# Patient Record
Sex: Male | Born: 1960 | Race: White | Hispanic: No | Marital: Married | Smoking: Never smoker
Health system: Southern US, Community
[De-identification: ages and names within clinical notes are randomized; demographics above are authoritative.]

## PROBLEM LIST (undated history)

## (undated) DIAGNOSIS — E079 Disorder of thyroid, unspecified: Secondary | ICD-10-CM

## (undated) DIAGNOSIS — E785 Hyperlipidemia, unspecified: Secondary | ICD-10-CM

## (undated) DIAGNOSIS — M1990 Unspecified osteoarthritis, unspecified site: Secondary | ICD-10-CM

## (undated) HISTORY — DX: Unspecified osteoarthritis, unspecified site: M19.90

## (undated) HISTORY — DX: Hyperlipidemia, unspecified: E78.5

## (undated) HISTORY — DX: Disorder of thyroid, unspecified: E07.9

## (undated) HISTORY — PX: HERNIA REPAIR: SHX51

---

## 2001-02-22 ENCOUNTER — Encounter: Payer: Self-pay | Admitting: Family Medicine

## 2001-02-22 ENCOUNTER — Encounter: Admission: RE | Admit: 2001-02-22 | Discharge: 2001-02-22 | Payer: Self-pay | Admitting: Family Medicine

## 2005-03-29 ENCOUNTER — Encounter: Admission: RE | Admit: 2005-03-29 | Discharge: 2005-03-29 | Payer: Self-pay | Admitting: Family Medicine

## 2010-12-22 ENCOUNTER — Other Ambulatory Visit: Payer: Self-pay | Admitting: Family Medicine

## 2010-12-28 ENCOUNTER — Ambulatory Visit
Admission: RE | Admit: 2010-12-28 | Discharge: 2010-12-28 | Disposition: A | Discharge status: Home / Self Care | Payer: BC Managed Care – PPO | Source: Ambulatory Visit | Attending: Family Medicine | Admitting: Family Medicine

## 2013-05-09 IMAGING — US US ABDOMEN COMPLETE
1 series · 14 of 25 positions shown · non-contrast
Comparison: None.

CLINICAL DATA: Elevated LFTs.

COMPLETE ABDOMINAL ULTRASOUND

[Series 1: us abdomen complete · 0.27mm/px · 14 of 79 slices shown]
[im 1/79]
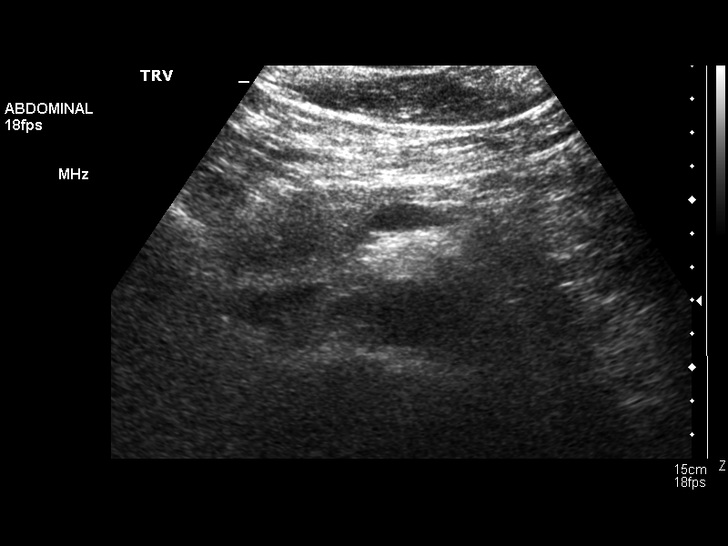
[im 7/79]
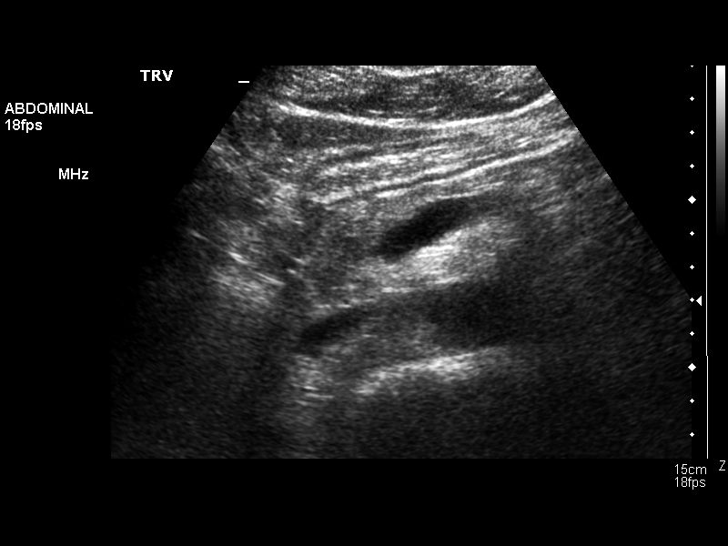
[im 14/79]
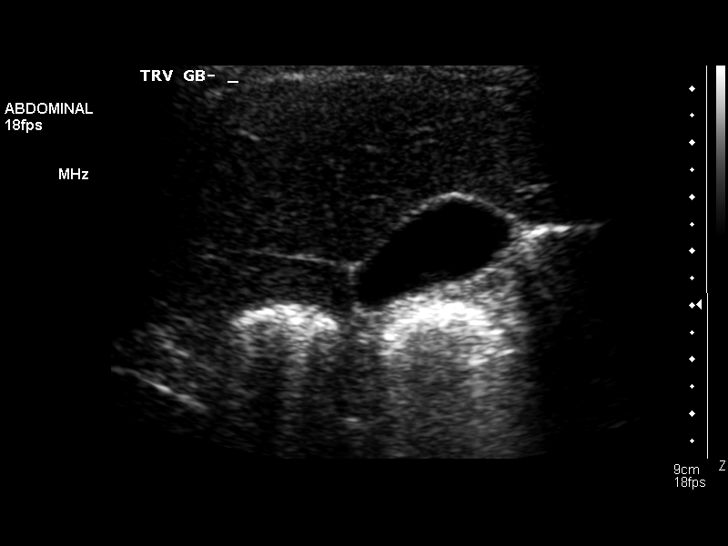
[im 20/79]
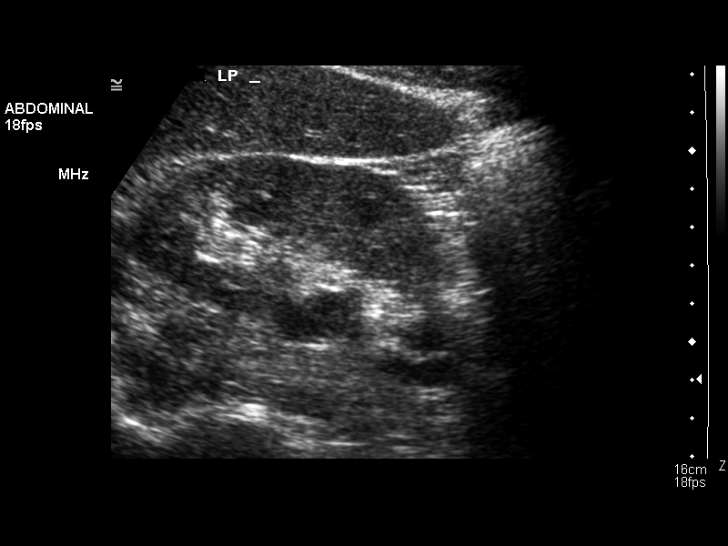
[im 27/79]
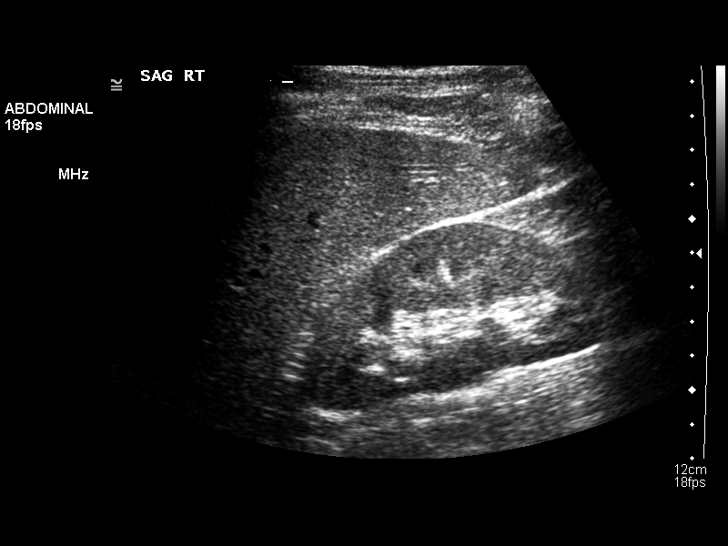
[im 30/79]
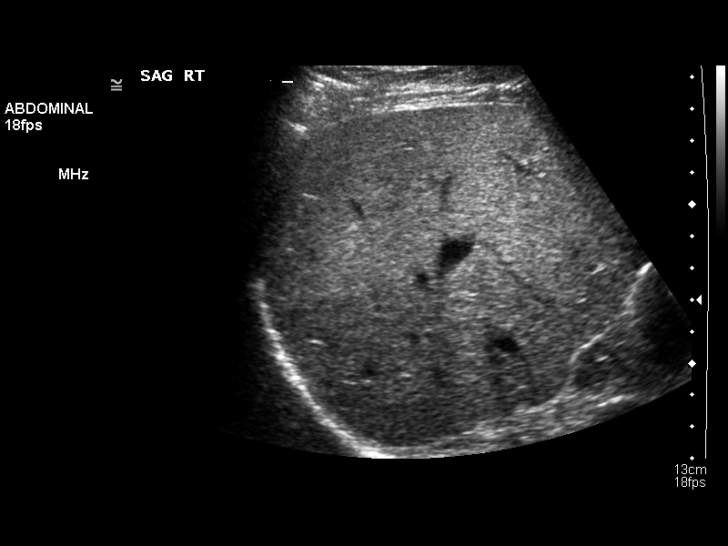
[im 36/79]
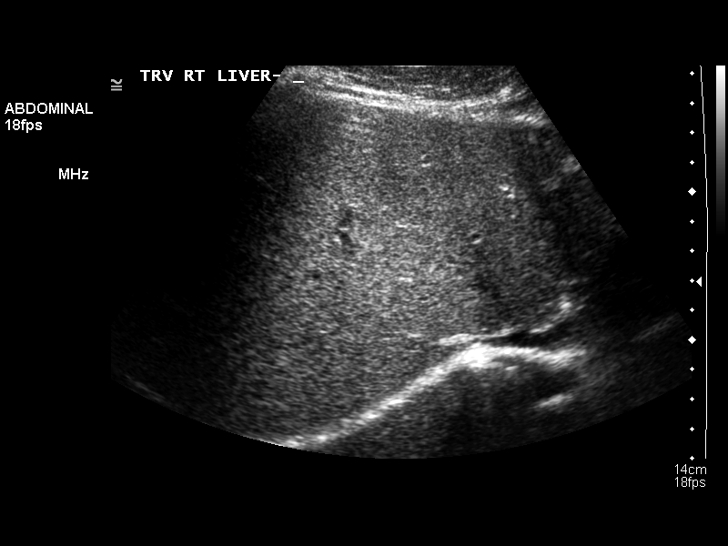
[im 43/79]
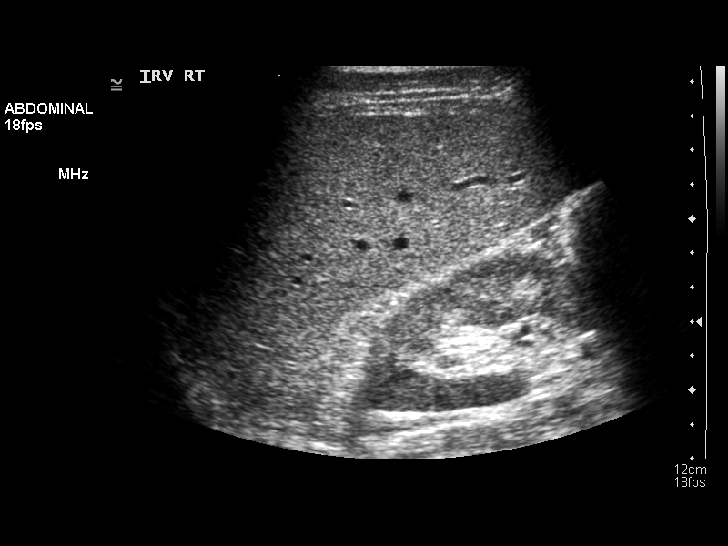
[im 49/79]
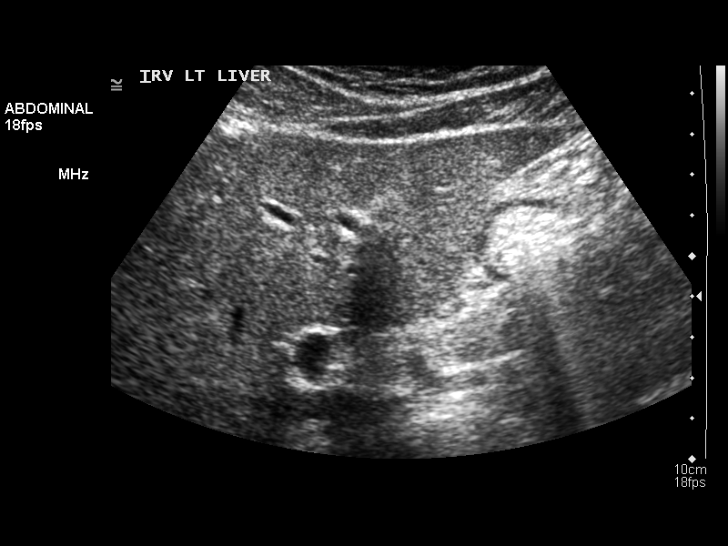
[im 53/79]
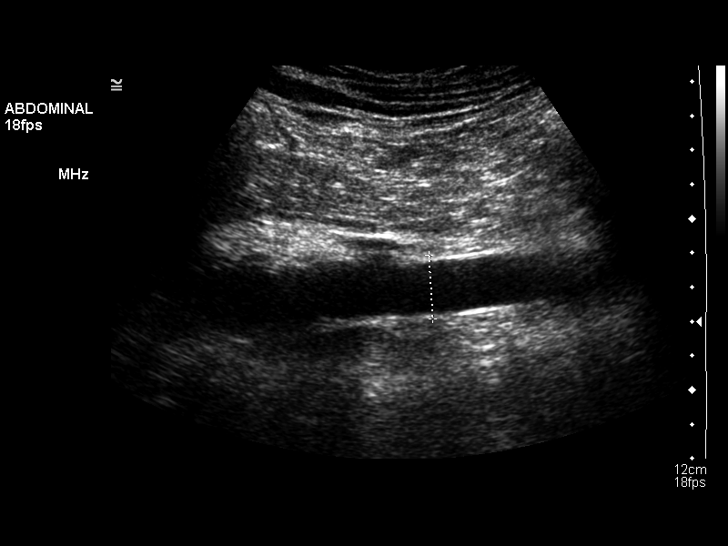
[im 59/79]
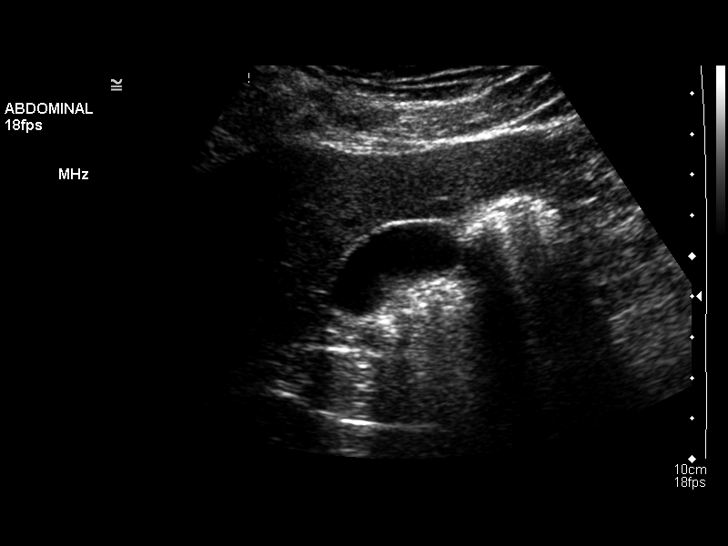
[im 66/79]
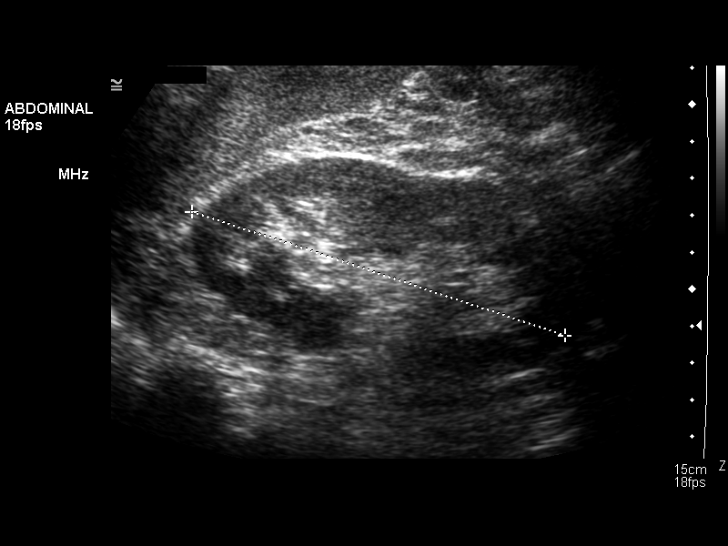
[im 72/79]
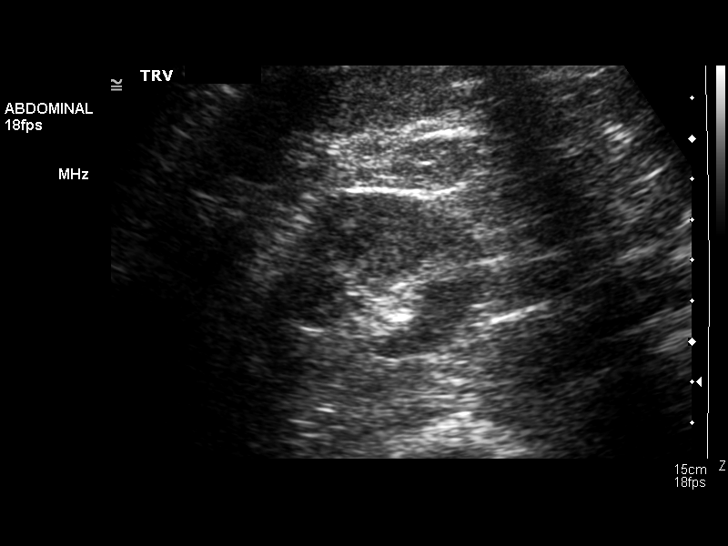
[im 79/79]
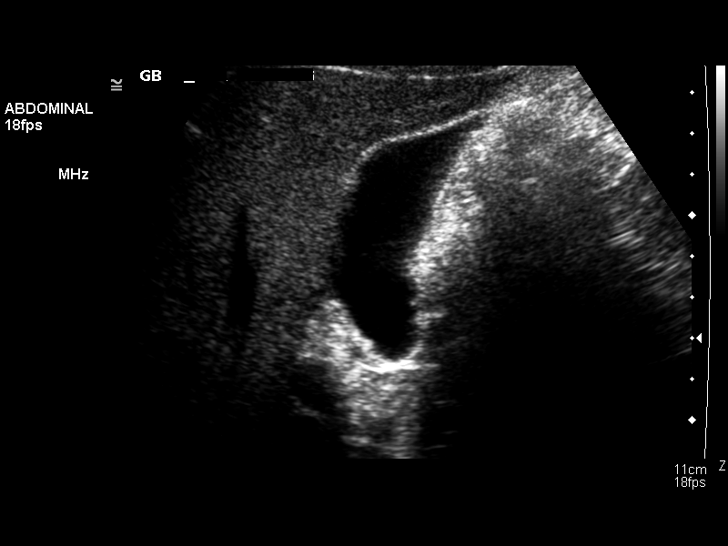

[14 of 25 positions shown; findings below may reference images not displayed]

FINDINGS: Gallbladder:  No stones or wall thickening.  Negative sonographic
Jumper.

Common bile duct:   Normal caliber, 4 mm.

Liver:  No focal lesion identified.  Within normal limits in
parenchymal echogenicity.

IVC:  Appears normal.

Pancreas:  No focal abnormality seen.

Spleen:  Within normal limits in size and echotexture.

Right Kidney:   Normal in size and parenchymal echogenicity.  No
evidence of mass or hydronephrosis.

Left Kidney:  Normal in size and parenchymal echogenicity.  No
evidence of mass or hydronephrosis.

Abdominal aorta:  No aneurysm identified.
IMPRESSION: No acute findings or significant abnormality.

## 2013-09-27 LAB — HM COLONOSCOPY

## 2013-10-03 ENCOUNTER — Other Ambulatory Visit (HOSPITAL_BASED_OUTPATIENT_CLINIC_OR_DEPARTMENT_OTHER): Payer: Self-pay | Admitting: Physician Assistant

## 2013-10-03 DIAGNOSIS — R109 Unspecified abdominal pain: Secondary | ICD-10-CM

## 2013-10-08 ENCOUNTER — Ambulatory Visit (HOSPITAL_BASED_OUTPATIENT_CLINIC_OR_DEPARTMENT_OTHER): Admission: RE | Admit: 2013-10-08 | Payer: BC Managed Care – PPO | Source: Ambulatory Visit

## 2014-10-15 LAB — LIPID PANEL
Cholesterol: 162 mg/dL (ref 0–200)
HDL: 31 mg/dL — AB (ref 35–70)
LDL Cholesterol: 79 mg/dL
LDl/HDL Ratio: 5.3
Triglycerides: 265 mg/dL — AB (ref 40–160)

## 2014-10-15 LAB — HEPATIC FUNCTION PANEL
ALT: 29 U/L (ref 10–40)
AST: 23 U/L (ref 14–40)
Alkaline Phosphatase: 88 U/L (ref 25–125)
Bilirubin, Total: 1.6 mg/dL

## 2014-10-15 LAB — BASIC METABOLIC PANEL
BUN: 16 mg/dL (ref 4–21)
Creatinine: 0.9 mg/dL (ref 0.6–1.3)
Glucose: 92 mg/dL
Potassium: 4.3 mmol/L (ref 3.4–5.3)
Sodium: 138 mmol/L (ref 137–147)

## 2015-12-22 ENCOUNTER — Encounter: Payer: Self-pay | Admitting: General Practice

## 2015-12-22 ENCOUNTER — Ambulatory Visit (INDEPENDENT_AMBULATORY_CARE_PROVIDER_SITE_OTHER): Payer: BC Managed Care – PPO | Admitting: Family Medicine

## 2015-12-22 ENCOUNTER — Encounter: Payer: Self-pay | Admitting: Family Medicine

## 2015-12-22 VITALS — BP 110/72 | HR 72 | Temp 98.0°F | Resp 16 | Ht 72.0 in | Wt 194.5 lb

## 2015-12-22 DIAGNOSIS — Z0000 Encounter for general adult medical examination without abnormal findings: Secondary | ICD-10-CM | POA: Diagnosis not present

## 2015-12-22 DIAGNOSIS — Z23 Encounter for immunization: Secondary | ICD-10-CM

## 2015-12-22 LAB — BASIC METABOLIC PANEL
BUN: 14 mg/dL (ref 6–23)
CALCIUM: 9.6 mg/dL (ref 8.4–10.5)
CO2: 30 mEq/L (ref 19–32)
CREATININE: 0.98 mg/dL (ref 0.40–1.50)
Chloride: 104 mEq/L (ref 96–112)
GFR: 84.06 mL/min (ref >60.00)
Glucose, Bld: 83 mg/dL (ref 70–99)
Potassium: 3.9 mEq/L (ref 3.5–5.1)
SODIUM: 141 meq/L (ref 135–145)

## 2015-12-22 LAB — HEPATIC FUNCTION PANEL
ALT: 27 U/L (ref 0–53)
AST: 18 U/L (ref 0–37)
Albumin: 4.6 g/dL (ref 3.5–5.2)
Alkaline Phosphatase: 106 U/L (ref 39–117)
BILIRUBIN DIRECT: 0.3 mg/dL (ref 0.0–0.3)
BILIRUBIN TOTAL: 2.5 mg/dL — AB (ref 0.2–1.2)
Total Protein: 7.2 g/dL (ref 6.0–8.3)

## 2015-12-22 LAB — CBC WITH DIFFERENTIAL/PLATELET
BASOS PCT: 0.3 % (ref 0.0–3.0)
Basophils Absolute: 0 10*3/uL (ref 0.0–0.1)
EOS ABS: 0 10*3/uL (ref 0.0–0.7)
EOS PCT: 0.6 % (ref 0.0–5.0)
HEMATOCRIT: 44.9 % (ref 39.0–52.0)
HEMOGLOBIN: 15.4 g/dL (ref 13.0–17.0)
LYMPHS PCT: 34.1 % (ref 12.0–46.0)
Lymphs Abs: 2.5 10*3/uL (ref 0.7–4.0)
MCHC: 34.4 g/dL (ref 30.0–36.0)
MCV: 94.1 fl (ref 78.0–100.0)
MONOS PCT: 8.4 % (ref 3.0–12.0)
Monocytes Absolute: 0.6 10*3/uL (ref 0.1–1.0)
NEUTROS ABS: 4.2 10*3/uL (ref 1.4–7.7)
Neutrophils Relative %: 56.6 % (ref 43.0–77.0)
Platelets: 265 10*3/uL (ref 150.0–400.0)
RBC: 4.77 Mil/uL (ref 4.22–5.81)
RDW: 12.7 % (ref 11.5–15.5)
WBC: 7.4 10*3/uL (ref 4.0–10.5)

## 2015-12-22 LAB — LIPID PANEL
CHOL/HDL RATIO: 5
Cholesterol: 194 mg/dL (ref 0–200)
HDL: 43 mg/dL (ref >39.00)
LDL Cholesterol: 117 mg/dL — ABNORMAL HIGH (ref 0–99)
NONHDL: 150.87
Triglycerides: 171 mg/dL — ABNORMAL HIGH (ref 0.0–149.0)
VLDL: 34.2 mg/dL (ref 0.0–40.0)

## 2015-12-22 LAB — PSA: PSA: 0.88 ng/mL (ref 0.10–4.00)

## 2015-12-22 LAB — TSH: TSH: 1.95 u[IU]/mL (ref 0.35–4.50)

## 2015-12-22 NOTE — Progress Notes (Signed)
Pre visit review using our clinic review tool, if applicable. No additional management support is needed unless otherwise documented below in the visit note. 

## 2015-12-22 NOTE — Progress Notes (Signed)
   Subjective:    Patient ID: Juan Oneill, male    DOB: [birth year], 55 y.o.   MRN: [id number]  HPI New to establish.  Previous MD- [facility]- done 2015 in [place] at [facility], due for repeat 2018  CPE- no concerns today.  No record of Tdap.  UTD on flu shot.    Review of Systems Patient reports no vision/hearing changes, anorexia, fever ,adenopathy, persistant/recurrent hoarseness, swallowing issues, chest pain, palpitations, edema, persistant/recurrent cough, hemoptysis, dyspnea (rest,exertional, paroxysmal nocturnal), gastrointestinal  bleeding (melena, rectal bleeding), abdominal pain, excessive heart burn, GU symptoms (dysuria, hematuria, voiding/incontinence issues) syncope, focal weakness, memory loss, numbness & tingling, skin/hair/nail changes, depression, anxiety, abnormal bruising/bleeding, musculoskeletal symptoms/signs.     Objective:   Physical Exam BP 110/72   Pulse 72   Temp 98 F (36.7 C) (Oral)   Resp 16   Ht 6' (1.829 m)   Wt 194 lb 8 oz (88.2 kg)   SpO2 98%   BMI 26.38 kg/m   General Appearance:    Alert, cooperative, no distress, appears stated age  Head:    Normocephalic, without obvious abnormality, atraumatic  Eyes:    PERRL, conjunctiva/corneas clear, EOM's intact, fundi    benign, both eyes       Ears:    Normal TM's and external ear canals, both ears  Nose:   Nares normal, septum midline, mucosa normal, no drainage   or sinus tenderness  Throat:   Lips, mucosa, and tongue normal; teeth and gums normal  Neck:   Supple, symmetrical, trachea midline, no adenopathy;       thyroid:  No enlargement/tenderness/nodules  Back:     Symmetric, no curvature, ROM normal, no CVA tenderness  Lungs:     Clear to auscultation bilaterally, respirations unlabored  Chest wall:    No tenderness or deformity  Heart:    Regular rate and rhythm, S1 and S2 normal, no murmur, rub   or gallop  Abdomen:     Soft, non-tender, bowel sounds active  all four quadrants,    no masses, no organomegaly  Genitalia:    Normal male without lesion, discharge or tenderness  Rectal:    Normal tone, normal prostate, no masses or tenderness  Extremities:   Extremities normal, atraumatic, no cyanosis or edema  Pulses:   2+ and symmetric all extremities  Skin:   Skin color, texture, turgor normal, no rashes or lesions  Lymph nodes:   Cervical, supraclavicular, and axillary nodes normal  Neurologic:   CNII-XII intact. Normal strength, sensation and reflexes      throughout          Assessment & Plan:  PE- pt's PE WNL.  UTD on colonoscopy until next year.  Has already had flu shot.  Tdap updated today.  Check labs.  Anticipatory guidance provided.

## 2015-12-22 NOTE — Addendum Note (Signed)
Addended by: Desmond Dike L on: 12/22/2015 03:00 PM   Modules accepted: Orders

## 2015-12-22 NOTE — Patient Instructions (Signed)
Follow up in 1 year or as needed We'll notify you of your lab results and make any changes if needed Continue to work on healthy diet and regular exercise- you look great! We updated your tetanus shot today Call with any questions or concerns Welcome!  We're glad to have you!!!

## 2015-12-23 ENCOUNTER — Encounter: Payer: Self-pay | Admitting: General Practice

## 2016-05-20 ENCOUNTER — Emergency Department (HOSPITAL_BASED_OUTPATIENT_CLINIC_OR_DEPARTMENT_OTHER): Payer: BC Managed Care – PPO

## 2016-05-20 ENCOUNTER — Emergency Department (HOSPITAL_BASED_OUTPATIENT_CLINIC_OR_DEPARTMENT_OTHER)
Admission: EM | Admit: 2016-05-20 | Discharge: 2016-05-20 | Disposition: A | Discharge status: Home / Self Care | Payer: BC Managed Care – PPO | Attending: Emergency Medicine | Admitting: Emergency Medicine

## 2016-05-20 ENCOUNTER — Encounter (HOSPITAL_BASED_OUTPATIENT_CLINIC_OR_DEPARTMENT_OTHER): Payer: Self-pay | Admitting: *Deleted

## 2016-05-20 DIAGNOSIS — Y929 Unspecified place or not applicable: Secondary | ICD-10-CM | POA: Diagnosis not present

## 2016-05-20 DIAGNOSIS — W5519XA Other contact with horse, initial encounter: Secondary | ICD-10-CM | POA: Diagnosis not present

## 2016-05-20 DIAGNOSIS — Y939 Activity, unspecified: Secondary | ICD-10-CM | POA: Insufficient documentation

## 2016-05-20 DIAGNOSIS — Y999 Unspecified external cause status: Secondary | ICD-10-CM | POA: Insufficient documentation

## 2016-05-20 DIAGNOSIS — S6990XA Unspecified injury of unspecified wrist, hand and finger(s), initial encounter: Secondary | ICD-10-CM

## 2016-05-20 DIAGNOSIS — S61215A Laceration without foreign body of left ring finger without damage to nail, initial encounter: Secondary | ICD-10-CM | POA: Diagnosis not present

## 2016-05-20 MED ORDER — CEPHALEXIN 500 MG PO CAPS: 500.0000 mg | ORAL_CAPSULE | Freq: Three times a day (TID) | ORAL | 0 refills | Status: DC

## 2016-05-20 MED ORDER — BACITRACIN ZINC 500 UNIT/GM EX OINT
TOPICAL_OINTMENT | Freq: Two times a day (BID) | CUTANEOUS | Status: DC
  Administered 2016-05-20: 13:00:00 via TOPICAL

## 2016-05-20 MED ORDER — LIDOCAINE HCL 2 % IJ SOLN
10.0000 mL | Freq: Once | INTRAMUSCULAR | Status: AC
  Administered 2016-05-20: 200 mg via INTRADERMAL
  Filled 2016-05-20: qty 20

## 2016-05-20 NOTE — ED Notes (Signed)
Laceration noted to knuckle area 4th finger left hand, about 4-5 cm, bleeding controlled. CMS intact to left hand/nailbeds.

## 2016-05-20 NOTE — ED Triage Notes (Signed)
Patient states he was moving his horse and was kicked.  Laceration to the fourth left finger, bleeding controlled.

## 2016-05-20 NOTE — Discharge Instructions (Signed)
Keep your finger clean and dry. Apply bacitracin or neosporin 2 times a day. Keep finger in a splint until all healed. Suture removal in 10 days. Keflex prophylactically to prevent infection. Follow up as needed.

## 2016-05-20 NOTE — ED Provider Notes (Signed)
[facility] DEPT [facility] Provider Note   CSN: [id number] Arrival date & time: 05/20/16  1036     History   Chief Complaint Chief Complaint  Patient presents with  . Laceration    left fourth finger    HPI Juan Oneill is a 56 y.o. male.  HPI Juan Oneill is a 56 y.o. male presents to emergency department complaining of left ring finger injury. Patient states he was kicked by horse. States the home. Of course hit him right on the left ring finger sustaining a laceration to the finger. He states he went to urgent care was sent here for possible tendon injury. Dressing was applied to the injured side. States bleeding is controlled with pressure. Reports his tetanus up to date. Denies any numbness or weakness to the finger. No prior injuries to this finger.  Past Medical History:  Diagnosis Date  . Arthritis    GSo orto Dx with arthritis    There are no active problems to display for this patient.   Past Surgical History:  Procedure Laterality Date  . HERNIA REPAIR         Home Medications    Prior to Admission medications   Not on File    Family History Family History  Problem Relation Age of Onset  . Healthy Mother   . Cancer Paternal Grandmother     colon  . Diabetes Paternal Grandmother     Social History Social History  Substance Use Topics  . Smoking status: Never Smoker  . Smokeless tobacco: Never Used  . Alcohol use 1.2 oz/week    2 Cans of beer per week     Allergies   Patient has no known allergies.   Review of Systems Review of Systems  Constitutional: Negative for chills and fever.  Musculoskeletal: Positive for arthralgias.  Skin: Positive for wound.  Neurological: Negative for weakness and numbness.     Physical Exam Updated Vital Signs BP 111/74 (BP Location: Left Arm)   Pulse 80   Temp 98.4 F (36.9 C) (Oral)   Resp 20   Ht 6' (1.829 m)   Wt 90.7 kg   SpO2 100%   BMI 27.12 kg/m   Physical Exam    Constitutional: He appears well-developed and well-nourished. No distress.  Eyes: Conjunctivae are normal.  Neck: Neck supple.  Cardiovascular: Normal rate.   Pulmonary/Chest: No respiratory distress.  Abdominal: He exhibits no distension.  Musculoskeletal:  3 cm laceration to the dorsal left ring finger over proximal phalanx. No joint involvement. Full range of motion of the finger at MCP, PIP, DIP joints. Normal strength with flexion and extension. Capillary refill less than 2 seconds distally. Sensation is intact distally to the finger.  Skin: Skin is warm and dry.  Nursing note and vitals reviewed.    ED Treatments / Results  Labs (all labs ordered are listed, but only abnormal results are displayed) Labs Reviewed - No data to display  EKG  EKG Interpretation None       Radiology No results found.  Procedures Procedures (including critical care time) LACERATION REPAIR Performed by: Renold Genta Authorized by: Jeannett Senior A Consent: Verbal consent obtained. Risks and benefits: risks, benefits and alternatives were discussed Consent given by: patient Patient identity confirmed: provided demographic data Prepped and Draped in normal sterile fashion Wound explored  Laceration Location: left ring finger  Laceration Length: 3cm  No Foreign Bodies seen or palpated  Anesthesia: local infiltration  Local anesthetic: lidocaine  2% wo epinephrine  Anesthetic total: 3 ml  Irrigation method: syringe Amount of cleaning: standard  Skin closure: prolene 4.0  Number of sutures: 5  Technique: simple interrupted  Patient tolerance: Patient tolerated the procedure well with no immediate complications.  Medications Ordered in ED Medications  lidocaine (XYLOCAINE) 2 % (with pres) injection 200 mg (not administered)     Initial Impression / Assessment and Plan / ED Course  I have reviewed the triage vital signs and the nursing notes.  Pertinent  labs & imaging results that were available during my care of the patient were reviewed by me and considered in my medical decision making (see chart for details).     Pt with deep laceration to the left ring finger, neurovascularly intact with normal sensation distally, cap refill <2 sec distally. Xray negative. Wound irrigated thoroughly, tendon is visualized and intact. Normal movement and strength of the finger. Repaired with sutures. Pt did complain of sensation of dirt in left eye, rn to irrigate with saline.   Plan to splint finger, wound care at home, suture removal in 10 days. Will give referral to hand surgery if any issues.   Vitals:   05/20/16 1042 05/20/16 1043 05/20/16 1246  BP: 111/74  126/83  Pulse: 80  82  Resp: 20  18  Temp: 98.4 F (36.9 C)    TempSrc: Oral    SpO2: 100%  100%  Weight:  90.7 kg   Height:  6' (1.829 m)      Final Clinical Impressions(s) / ED Diagnoses   Final diagnoses:  Finger injury  Laceration of left ring finger without foreign body without damage to nail, initial encounter    New Prescriptions Discharge Medication List as of 05/20/2016 12:36 PM    START taking these medications   Details  cephALEXin (KEFLEX) 500 MG capsule Take 1 capsule (500 mg total) by mouth 3 (three) times daily., Starting Sat 05/20/2016, Print         Jeannett Senior, PA-C 05/20/16 [place], DO 05/21/16 0745

## 2016-05-31 ENCOUNTER — Encounter: Payer: Self-pay | Admitting: Physician Assistant

## 2016-05-31 ENCOUNTER — Ambulatory Visit (INDEPENDENT_AMBULATORY_CARE_PROVIDER_SITE_OTHER): Payer: BC Managed Care – PPO | Admitting: Physician Assistant

## 2016-05-31 NOTE — Progress Notes (Signed)
Erroneous encounter-disregard

## 2016-05-31 NOTE — Progress Notes (Signed)
Pre visit review using our clinic review tool, if applicable. No additional management support is needed unless otherwise documented below in the visit note. 

## 2016-06-02 ENCOUNTER — Encounter: Payer: Self-pay | Admitting: Physician Assistant

## 2016-06-02 ENCOUNTER — Ambulatory Visit (INDEPENDENT_AMBULATORY_CARE_PROVIDER_SITE_OTHER): Payer: BC Managed Care – PPO | Admitting: Physician Assistant

## 2016-06-02 VITALS — BP 106/60 | HR 88 | Temp 98.7°F | Resp 14 | Ht 72.0 in | Wt 197.0 lb

## 2016-06-02 DIAGNOSIS — Z4802 Encounter for removal of sutures: Secondary | ICD-10-CM

## 2016-06-02 NOTE — Patient Instructions (Signed)
Please keep skin clean and dry. You can apply very slight amount of lotion on the area to help with scarring and itching. Gentle squeeze a stress ball to help with ROM of hand.  Keep elevated. Swelling will continue to resolve.  If you note any redness, drainage, pain in the area please call or come see [place].

## 2016-06-02 NOTE — Progress Notes (Signed)
    Patient presents to clinic today for suture removal of left 4th phalanx. Patient seen in ER on 05/20/16 for laceration. Imaging obtained at that time shows non intraarticular laceration. 5 simple, interrupted sutures placed. Patient was instructed when to return for suture removal. Denies any redness, drainage or pain at site. Denies fever, chills. Did hit finger on nightstand early this morning and noted mild soft tissue swelling that is already resolving.   Past Medical History:  Diagnosis Date  . Arthritis    GSo orto Dx with arthritis    No current outpatient prescriptions on file prior to visit.   No current facility-administered medications on file prior to visit.     No Known Allergies  Family History  Problem Relation Age of Onset  . Healthy Mother   . Cancer Paternal Grandmother     colon  . Diabetes Paternal Grandmother     Social History   Social History  . Marital status: Married    Spouse name: N/A  . Number of children: N/A  . Years of education: N/A   Social History Main Topics  . Smoking status: Never Smoker  . Smokeless tobacco: Never Used  . Alcohol use 1.2 oz/week    2 Cans of beer per week  . Drug use: No  . Sexual activity: Not Asked   Other Topics Concern  . None   Social History Narrative  . None   Review of Systems - See HPI.  All other ROS are negative.  BP 106/60   Pulse 88   Temp 98.7 F (37.1 C) (Oral)   Resp 14   Ht 6' (1.829 m)   Wt 197 lb (89.4 kg)   SpO2 98%   BMI 26.72 kg/m   Physical Exam  Constitutional: He is well-developed, well-nourished, and in no distress.  HENT:  Head: Normocephalic and atraumatic.  Cardiovascular: Normal rate and regular rhythm.   Pulmonary/Chest: Effort normal.  Skin: Skin is warm and dry.     Psychiatric: Affect normal.  Vitals reviewed.   No results found for this or any previous visit (from the past 2160 hour(s)).  Assessment/Plan: 1. Visit for suture removal 5 sutures  successfully removed without complication. Discussed wound care and healing time frame. Gentle stretches discussed to work with ROM now that sutures are removed. If not improving, will have hand specialist consult.   Leeanne Rio, PA-C

## 2016-06-02 NOTE — Progress Notes (Signed)
Pre visit review using our clinic review tool, if applicable. No additional management support is needed unless otherwise documented below in the visit note. 

## 2017-02-22 ENCOUNTER — Encounter: Payer: Self-pay | Admitting: Family Medicine

## 2017-02-22 ENCOUNTER — Ambulatory Visit (INDEPENDENT_AMBULATORY_CARE_PROVIDER_SITE_OTHER): Payer: BC Managed Care – PPO | Admitting: Family Medicine

## 2017-02-22 ENCOUNTER — Encounter: Payer: BC Managed Care – PPO | Admitting: Family Medicine

## 2017-02-22 VITALS — BP 126/80 | HR 59 | Temp 97.7°F | Ht 72.5 in | Wt 193.0 lb

## 2017-02-22 DIAGNOSIS — Z0000 Encounter for general adult medical examination without abnormal findings: Secondary | ICD-10-CM | POA: Diagnosis not present

## 2017-02-22 DIAGNOSIS — D126 Benign neoplasm of colon, unspecified: Secondary | ICD-10-CM | POA: Insufficient documentation

## 2017-02-22 DIAGNOSIS — Z1159 Encounter for screening for other viral diseases: Secondary | ICD-10-CM | POA: Diagnosis not present

## 2017-02-22 DIAGNOSIS — K648 Other hemorrhoids: Secondary | ICD-10-CM | POA: Diagnosis not present

## 2017-02-22 DIAGNOSIS — H00011 Hordeolum externum right upper eyelid: Secondary | ICD-10-CM | POA: Diagnosis not present

## 2017-02-22 LAB — CBC WITH DIFFERENTIAL/PLATELET
BASOS ABS: 0 10*3/uL (ref 0.0–0.1)
Basophils Relative: 0.1 % (ref 0.0–3.0)
EOS ABS: 0.1 10*3/uL (ref 0.0–0.7)
Eosinophils Relative: 1 % (ref 0.0–5.0)
HEMATOCRIT: 46.1 % (ref 39.0–52.0)
HEMOGLOBIN: 15.3 g/dL (ref 13.0–17.0)
LYMPHS PCT: 31.6 % (ref 12.0–46.0)
Lymphs Abs: 1.8 10*3/uL (ref 0.7–4.0)
MCHC: 33.2 g/dL (ref 30.0–36.0)
MCV: 97.4 fl (ref 78.0–100.0)
MONOS PCT: 11.5 % (ref 3.0–12.0)
Monocytes Absolute: 0.7 10*3/uL (ref 0.1–1.0)
Neutro Abs: 3.2 10*3/uL (ref 1.4–7.7)
Neutrophils Relative %: 55.8 % (ref 43.0–77.0)
Platelets: 254 10*3/uL (ref 150.0–400.0)
RBC: 4.73 Mil/uL (ref 4.22–5.81)
RDW: 13 % (ref 11.5–15.5)
WBC: 5.7 10*3/uL (ref 4.0–10.5)

## 2017-02-22 LAB — COMPREHENSIVE METABOLIC PANEL
ALBUMIN: 4.4 g/dL (ref 3.5–5.2)
ALK PHOS: 111 U/L (ref 39–117)
ALT: 41 U/L (ref 0–53)
AST: 27 U/L (ref 0–37)
BILIRUBIN TOTAL: 2.9 mg/dL — AB (ref 0.2–1.2)
BUN: 15 mg/dL (ref 6–23)
CALCIUM: 9.3 mg/dL (ref 8.4–10.5)
CO2: 29 mEq/L (ref 19–32)
CREATININE: 0.93 mg/dL (ref 0.40–1.50)
Chloride: 104 mEq/L (ref 96–112)
GFR: 88.93 mL/min (ref >60.00)
Glucose, Bld: 90 mg/dL (ref 70–99)
Potassium: 4.2 mEq/L (ref 3.5–5.1)
Sodium: 139 mEq/L (ref 135–145)
TOTAL PROTEIN: 6.9 g/dL (ref 6.0–8.3)

## 2017-02-22 LAB — LIPID PANEL
CHOL/HDL RATIO: 5
Cholesterol: 197 mg/dL (ref 0–200)
HDL: 42.2 mg/dL (ref >39.00)
LDL Cholesterol: 125 mg/dL — ABNORMAL HIGH (ref 0–99)
NONHDL: 154.35
TRIGLYCERIDES: 146 mg/dL (ref 0.0–149.0)
VLDL: 29.2 mg/dL (ref 0.0–40.0)

## 2017-02-22 NOTE — Patient Instructions (Addendum)
Please return in one year for your annual exam.  I will release your lab results to you on your MyChart account with further instructions. Please reply with any questions.    Please do these things to maintain good health!   Exercise at least 30-45 minutes a day,  4-5 days a week.   Eat a low-fat diet with lots of fruits and vegetables, up to 7-9 servings per day.  Drink plenty of water daily. Try to drink 8 8oz glasses per day.  Seatbelts can save your life. Always wear your seatbelt.  Place Smoke Detectors on every level of your home and check batteries every year.  Eye Doctor - have an eye exam every 1-2 years  Safe sex - use condoms to protect yourself from STDs if you could be exposed to these types of infections.  Avoid heavy alcohol use. If you drink, keep it to less than 2 drinks/day and not every day.  [place].  Choose someone you trust that could speak for you if you became unable to speak for yourself.  Depression is common in our stressful world.If you're feeling down or losing interest in things you normally enjoy, please come in for a visit.

## 2017-02-22 NOTE — Progress Notes (Signed)
Subjective  Chief Complaint  Patient presents with  . Annual Exam    Fasting   HPI: Juan Oneill is a 56 y.o. male who presents to [place] at [facility] today for a Male Wellness Visit.   Wellness Visit: annual visit with health maintenance review and exam    Healthy 56 yo male with minor concerns:  Persistent left stye that comes and goes over the course of last year; used entire tube of e-mycin ointment. Was active about 10 days ago; now much better but still with some redness. No conjunctival irritation or involvement. utd on eye exam.  Int hemorrhoids with some bleeding over last few days; colonoscopy done in august with [facility] in [place]. No worrisome findings.   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 No abdominal pain or jaundice. Due hep c screen, routine  Discussed PSA screening; pt defers Lifestyle: Body mass index is 25.82 kg/m. Wt Readings from Last 3 Encounters:  02/22/17 193 lb (87.5 kg)  06/02/16 197 lb (89.4 kg)  05/31/16 197 lb (89.4 kg)   Diet: low fat Exercise: frequently, cross training  Patient Active Problem List   Diagnosis Date Noted  . Internal hemorrhoid 02/22/2017  . Adenomatous polyp of colon 02/22/2017  . 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 Hepatitis C Screening  14-Jun-1960  . HIV Screening  02/22/2018 (Originally 11/17/1975)  . COLONOSCOPY  08/27/2021  . TETANUS/TDAP  12/21/2025  . INFLUENZA VACCINE  Completed   Immunization History  Administered Date(s) Administered  . Influenza,inj,Quad PF,6+ Mos 12/14/2015  . Influenza-Unspecified 12/11/2016  . Tdap 02/27/2006, 12/22/2015   We updated and reviewed the patient's past history in detail and it is documented below. Allergies: Patient has No Known Allergies. Past Medical History  has a past medical history of Arthritis. Past Surgical History  has a past surgical history that includes  Hernia repair. Social History Patient  reports that  has never smoked. he has never used smokeless tobacco. He reports that he drinks about 1.2 oz of alcohol per week. He reports that he does not use drugs. Family History Patient family history includes Cancer in his paternal grandmother; Diabetes in his paternal grandmother; Healthy in his mother. Review of Systems: Constitutional: negative for fever or malaise Ophthalmic: negative for photophobia, double vision or loss of vision Cardiovascular: negative for chest pain, dyspnea on exertion, or new LE swelling Respiratory: negative for SOB or persistent cough Gastrointestinal: negative for abdominal pain, change in bowel habits or melena Genitourinary: negative for dysuria or gross hematuria Musculoskeletal: negative for new gait disturbance or muscular weakness Integumentary: negative for new or persistent rashes, no breast lumps Neurological: negative for TIA or stroke symptoms Psychiatric: negative for SI or delusions Allergic/Immunologic: negative for hives  Patient Care Team    Relationship Specialty Notifications Start End  Midge Minium, MD PCP - General Family Medicine  12/22/15   [facility]  Gastroenterology  02/22/17    Objective  Vitals: BP 126/80 (BP Location: Left Arm, Patient Position: Sitting, Cuff Size: Normal)   Pulse (!) 59   Temp 97.7 F (36.5 C) (Oral)   Ht 6' 0.5" (1.842 m)   Wt 193 lb (87.5 kg)   SpO2 99%   BMI 25.82 kg/m  General:  Well developed, well nourished, no acute distress  Psych:  Alert and orientedx3,normal mood and affect HEENT:  Normocephalic, atraumatic, non-icteric sclera, left upper lid with  2 mm stye, mildly red, no drainage, PERRL, oropharynx is clear without mass or exudate, supple neck without adenopathy, mass or thyromegaly Cardiovascular:  Normal S1, S2, RRR without gallop, rub or murmur, nondisplaced PMI, +2 distal pulses in bilateral upper and lower  extremities. Respiratory:  Good breath sounds bilaterally, CTAB with normal respiratory effort Gastrointestinal: normal bowel sounds, soft, non-tender, no noted masses. No HSM MSK: no deformities, contusions. Joints are without erythema or swelling. Spine and CVA region are nontender Skin:  Warm, no rashes or suspicious lesions noted Neurologic:    Mental status is normal. CN 2-11 are normal. Gross motor and sensory exams are normal. Stable gait. No tremor GU: No inguinal hernias or adenopathy are appreciated bilaterally  Assessment  1. Annual physical exam   2. Need for hepatitis C screening test   3. Internal hemorrhoid   4. Hordeolum externum of right upper eyelid   5. Rosanna Randy syndrome      Plan  Male Wellness Visit:  Age appropriate Health Maintenance and Prevention measures were discussed with patient. Included topics are cancer screening recommendations, ways to keep healthy (see AVS) including dietary and exercise recommendations, regular eye and dental care, use of seat belts, and avoidance of moderate alcohol use and tobacco use. Hep c screen ordered  BMI: discussed patient's BMI and encouraged positive lifestyle modifications to help get to or maintain a target BMI.  HM needs and immunizations were addressed and ordered. See below for orders. See HM and immunization section for updates. Up to date  Routine labs and screening tests ordered including cmp, cbc and lipids where appropriate.  Discussed recommendations regarding Vit D and calcium supplementation (see AVS)  Reassured about hemorrhoidal bleeding  rec eye exam if stye returns for excision or drainage.   Follow up: Return in about 12 months (around 02/22/2018) for your complete physical, please come fasting.   Commons side effects, risks, benefits, and alternatives for medications and treatment plan prescribed today were discussed, and the patient expressed understanding of the given instructions. Patient is  instructed to call or message via MyChart if he/she has any questions or concerns regarding our treatment plan. No barriers to understanding were identified. We discussed Red Flag symptoms and signs in detail. Patient expressed understanding regarding what to do in case of urgent or emergency type symptoms.   Medication list was reconciled, printed and provided to the patient in AVS. Patient instructions and summary information was reviewed with the patient as documented in the AVS. This note was prepared with assistance of Dragon voice recognition software. Occasional wrong-word or sound-a-like substitutions may have occurred due to the inherent limitations of voice recognition software  Orders Placed This Encounter  Procedures  . CBC with Differential/Platelet  . Comprehensive metabolic panel  . Lipid panel  . Hepatitis C antibody   No orders of the defined types were placed in this encounter.

## 2017-02-23 LAB — HEPATITIS C ANTIBODY
Hepatitis C Ab: NONREACTIVE
SIGNAL TO CUT-OFF: 0.02 (ref <1.00)

## 2017-03-07 ENCOUNTER — Telehealth: Payer: Self-pay | Admitting: Family Medicine

## 2017-03-07 NOTE — Telephone Encounter (Signed)
Copied from [id number]. Topic: Quick Communication - See Telephone Encounter >> Mar 07, 2017  4:15 PM Cleaster Corin, NT wrote: CRM for notification. See Telephone encounter for:   03/07/17. Pt calling to ask Dr. Jonni Sanger or nurse if he needs to see the  eye doctor that actually does his eye exams please give pt a call at [phone]

## 2017-03-08 ENCOUNTER — Telehealth: Payer: Self-pay | Admitting: Family Medicine

## 2017-03-08 NOTE — Telephone Encounter (Signed)
Juan Oneill - Can notes from CPE be sent over to eye doctor?

## 2017-03-08 NOTE — Telephone Encounter (Signed)
Left detailed message on personal voicemail. Dr. Jonni Sanger recommends you see the Eye doctor for the stye on your eye. She said if it returns or is still bothering you that you should see eye doctor about excising and draining it. Any questions please call office.

## 2017-03-08 NOTE — Telephone Encounter (Signed)
Copied from [place] [id number]. Topic: Quick Communication - See Telephone Encounter >> Mar 07, 2017  4:15 PM Cleaster Corin, NT wrote: CRM for notification. See Telephone encounter for:   03/07/17. Pt calling to ask Dr. Jonni Sanger or nurse if he needs to see the  eye doctor that actually does his eye exams please give pt a call at [phone]  >> Mar 08, 2017  9:42 AM Cleaster Corin, NT wrote: Altha Harm from [facility] calling and they will need notes from pt. Last visit from the doctor 02-22-17. Pt. Will see the eye doctor tomorrow 03-09-17. Fax# [phone]

## 2017-03-09 NOTE — Telephone Encounter (Signed)
Notes have been faxed

## 2017-09-26 ENCOUNTER — Ambulatory Visit: Payer: BC Managed Care – PPO | Admitting: Physician Assistant

## 2017-09-26 ENCOUNTER — Other Ambulatory Visit: Payer: Self-pay

## 2017-09-26 ENCOUNTER — Encounter: Payer: Self-pay | Admitting: Physician Assistant

## 2017-09-26 DIAGNOSIS — H81399 Other peripheral vertigo, unspecified ear: Secondary | ICD-10-CM

## 2017-09-26 MED ORDER — DIAZEPAM 5 MG PO TABS: 5.0000 mg | ORAL_TABLET | Freq: Two times a day (BID) | ORAL | 1 refills | Status: DC | PRN

## 2017-09-26 MED ORDER — ONDANSETRON HCL 4 MG PO TABS: 4.0000 mg | ORAL_TABLET | Freq: Three times a day (TID) | ORAL | 0 refills | Status: DC | PRN

## 2017-09-26 NOTE — Progress Notes (Signed)
Patient presents to clinic today c/o 2 days of dizziness with changing his head position. Notes + history of positional vertigo, with several episodes over the past couple of years. He denies ear pressure, pain, sinus congestion or URI symptoms. Denies AMS, vision changes, weakness or change in speech. Feeling better today compared to yesterday but still having occasional episodes, lasting a few minutes. They get better if he remains still. Denies fever, chills, malaise or fatigue. Has had one episode of non-bloody emesis yesterday during a spell.   Past Medical History:  Diagnosis Date  . Arthritis    GSo orto Dx with arthritis    No current outpatient medications on file prior to visit.   No current facility-administered medications on file prior to visit.     No Known Allergies  Family History  Problem Relation Age of Onset  . Healthy Mother   . Cancer Paternal Grandmother        colon  . Diabetes Paternal Grandmother     Social History   Socioeconomic History  . Marital status: Married    Spouse name: Not on file  . Number of children: Not on file  . Years of education: Not on file  . Highest education level: Not on file  Occupational History  . Not on file  Social Needs  . Financial resource strain: Not on file  . Food insecurity:    Worry: Not on file    Inability: Not on file  . Transportation needs:    Medical: Not on file    Non-medical: Not on file  Tobacco Use  . Smoking status: Never Smoker  . Smokeless tobacco: Never Used  Substance and Sexual Activity  . Alcohol use: Yes    Alcohol/week: 1.2 oz    Types: 2 Cans of beer per week  . Drug use: No  . Sexual activity: Not on file  Lifestyle  . Physical activity:    Days per week: Not on file    Minutes per session: Not on file  . Stress: Not on file  Relationships  . Social connections:    Talks on phone: Not on file    Gets together: Not on file    Attends religious service: Not on file   Active member of club or organization: Not on file    Attends meetings of clubs or organizations: Not on file    Relationship status: Not on file  Other Topics Concern  . Not on file  Social History Narrative  . Not on file   Review of Systems - See HPI.  All other ROS are negative.  BP 130/86   Pulse 66   Temp 97.9 F (36.6 C) (Oral)   Resp 16   Ht 6\' 1"  (1.854 m)   Wt 186 lb 4 oz (84.5 kg)   SpO2 98%   BMI 24.57 kg/m   Physical Exam  Constitutional: He is oriented to person, place, and time. He appears well-developed and well-nourished.  HENT:  Head: Normocephalic and atraumatic.  Right Ear: External ear normal.  Left Ear: External ear normal.  Nose: Nose normal.  Mouth/Throat: Oropharynx is clear and moist.  Eyes: Conjunctivae are normal.  Neck: Neck supple.  Cardiovascular: Normal rate, regular rhythm, normal heart sounds and intact distal pulses.  Pulmonary/Chest: Effort normal.  Neurological: He is alert and oriented to person, place, and time. No cranial nerve deficit.  + Marye Round  Psychiatric: He has a normal mood and affect.  Vitals  reviewed.  Assessment/Plan: 1. Other peripheral vertigo, unspecified ear Recurrent issue. Neuro examination is unremarkable today. Will start Zofran and Diazepam. Will obtain MRI to further assess giving history of recurrent episodes. Start Epley maneuvers at home as this continues to improve. May need to be set up for vestibular rehabilitation with PT.  - diazepam (VALIUM) 5 MG tablet; Take 1 tablet (5 mg total) by mouth every 12 (twelve) hours as needed (vertigo).  Dispense: 30 tablet; Refill: 1 - ondansetron (ZOFRAN) 4 MG tablet; Take 1 tablet (4 mg total) by mouth every 8 (eight) hours as needed for nausea or vomiting.  Dispense: 20 tablet; Refill: 0 - MR BRAIN WO CONTRAST; Future   Leeanne Rio, PA-C

## 2017-09-26 NOTE — Patient Instructions (Signed)
Please keep well-hydrated and get plenty of rest. Limit salt intake. Take the Zofran and Diazepam as directed when needed for nausea and vertigo respectively. Do not drive while on this medication.  We are proceeding with MRI to r/o any concerning causes for these recurrent episodes.  Once improving, we will start the exercises below.   How to Perform the Epley Maneuver The Epley maneuver is an exercise that relieves symptoms of vertigo. Vertigo is the feeling that you or your surroundings are moving when they are not. When you feel vertigo, you may feel like the room is spinning and have trouble walking. Dizziness is a little different than vertigo. When you are dizzy, you may feel unsteady or light-headed. You can do this maneuver at home whenever you have symptoms of vertigo. You can do it up to 3 times a day until your symptoms go away. Even though the Epley maneuver may relieve your vertigo for a few weeks, it is possible that your symptoms will return. This maneuver relieves vertigo, but it does not relieve dizziness. What are the risks? If it is done correctly, the Epley maneuver is considered safe. Sometimes it can lead to dizziness or nausea that goes away after a short time. If you develop other symptoms, such as changes in vision, weakness, or numbness, stop doing the maneuver and call your health care provider. How to perform the Epley maneuver 1. Sit on the edge of a bed or table with your back straight and your legs extended or hanging over the edge of the bed or table. 2. Turn your head halfway toward the affected ear or side. 3. Lie backward quickly with your head turned until you are lying flat on your back. You may want to position a pillow under your shoulders. 4. Hold this position for 30 seconds. You may experience an attack of vertigo. This is normal. 5. Turn your head to the opposite direction until your unaffected ear is facing the floor. 6. Hold this position for 30  seconds. You may experience an attack of vertigo. This is normal. Hold this position until the vertigo stops. 7. Turn your whole body to the same side as your head. Hold for another 30 seconds. 8. Sit back up. You can repeat this exercise up to 3 times a day. Follow these instructions at home:  After doing the Epley maneuver, you can return to your normal activities.  Ask your health care provider if there is anything you should do at home to prevent vertigo. He or she may recommend that you: ? Keep your head raised (elevated) with two or more pillows while you sleep. ? Do not sleep on the side of your affected ear. ? Get up slowly from bed. ? Avoid sudden movements during the day. ? Avoid extreme head movement, like looking up or bending over. Contact a health care provider if:  Your vertigo gets worse.  You have other symptoms, including: ? Nausea. ? Vomiting. ? Headache. Get help right away if:  You have vision changes.  You have a severe or worsening headache or neck pain.  You cannot stop vomiting.  You have new numbness or weakness in any part of your body. Summary  Vertigo is the feeling that you or your surroundings are moving when they are not.  The Epley maneuver is an exercise that relieves symptoms of vertigo.  If the Epley maneuver is done correctly, it is considered safe. You can do it up to 3 times a  day. This information is not intended to replace advice given to you by your health care provider. Make sure you discuss any questions you have with your health care provider. Document Released: 02/18/2013 Document Revised: 01/04/2016 Document Reviewed: 01/04/2016 Elsevier Interactive Patient Education  2017 [employer].

## 2017-10-17 ENCOUNTER — Other Ambulatory Visit: Payer: Self-pay

## 2017-10-17 ENCOUNTER — Telehealth: Payer: Self-pay | Admitting: Emergency Medicine

## 2017-10-17 DIAGNOSIS — H81399 Other peripheral vertigo, unspecified ear: Secondary | ICD-10-CM

## 2017-10-17 NOTE — Telephone Encounter (Signed)
Called patient and let him know that I have placed the referral for the ENT for him and it may take up to 2 weeks before he is contacted with an appointment.

## 2017-10-17 NOTE — Telephone Encounter (Signed)
Copied from [place] [id number]. Topic: Referral - Request >> Oct 17, 2017 12:15 PM Ivar Drape wrote: Reason for CRM:   Patient stated he was advised by a provider to get an MRI for his Vertigo, but he would like to try seeing an ENT first, so the patient would like a referral to an ENT.

## 2017-10-17 NOTE — Telephone Encounter (Signed)
Last OV 09/26/17, Next OV 02/25/18  Please advise.

## 2017-10-17 NOTE — Telephone Encounter (Signed)
[place] for ENT referral for Vertigo

## 2017-10-19 ENCOUNTER — Other Ambulatory Visit: Payer: BC Managed Care – PPO

## 2017-11-07 ENCOUNTER — Telehealth: Payer: Self-pay | Admitting: Emergency Medicine

## 2017-11-07 NOTE — Telephone Encounter (Signed)
Copied from [place] [id number]. Topic: Referral - Status >> Nov 07, 2017  8:23 AM Juan Oneill, NT wrote: Reason for CRM: patient is calling and states he received a call this morning with a message stating that [facility] ent has tried to reach him multiple times with no success. The patient states he has been in contact with them and has an appointment on 12/24/17. Please advise.

## 2017-12-05 DIAGNOSIS — R42 Dizziness and giddiness: Secondary | ICD-10-CM | POA: Insufficient documentation

## 2018-02-25 ENCOUNTER — Ambulatory Visit (INDEPENDENT_AMBULATORY_CARE_PROVIDER_SITE_OTHER): Payer: BC Managed Care – PPO | Admitting: Family Medicine

## 2018-02-25 ENCOUNTER — Other Ambulatory Visit: Payer: Self-pay

## 2018-02-25 ENCOUNTER — Encounter: Payer: Self-pay | Admitting: Family Medicine

## 2018-02-25 ENCOUNTER — Encounter: Payer: Self-pay | Admitting: General Practice

## 2018-02-25 VITALS — BP 124/82 | HR 76 | Temp 98.9°F | Resp 16 | Ht 73.0 in | Wt 191.5 lb

## 2018-02-25 DIAGNOSIS — Z125 Encounter for screening for malignant neoplasm of prostate: Secondary | ICD-10-CM

## 2018-02-25 DIAGNOSIS — E663 Overweight: Secondary | ICD-10-CM

## 2018-02-25 DIAGNOSIS — M25561 Pain in right knee: Secondary | ICD-10-CM | POA: Diagnosis not present

## 2018-02-25 DIAGNOSIS — G8929 Other chronic pain: Secondary | ICD-10-CM

## 2018-02-25 DIAGNOSIS — Z0000 Encounter for general adult medical examination without abnormal findings: Secondary | ICD-10-CM | POA: Diagnosis not present

## 2018-02-25 LAB — LIPID PANEL
Cholesterol: 196 mg/dL (ref 0–200)
HDL: 46 mg/dL (ref >39.00)
LDL CALC: 126 mg/dL — AB (ref 0–99)
NonHDL: 150.04
TRIGLYCERIDES: 121 mg/dL (ref 0.0–149.0)
Total CHOL/HDL Ratio: 4
VLDL: 24.2 mg/dL (ref 0.0–40.0)

## 2018-02-25 LAB — CBC WITH DIFFERENTIAL/PLATELET
BASOS ABS: 0 10*3/uL (ref 0.0–0.1)
Basophils Relative: 0.2 % (ref 0.0–3.0)
EOS ABS: 0.1 10*3/uL (ref 0.0–0.7)
Eosinophils Relative: 2 % (ref 0.0–5.0)
HCT: 45.6 % (ref 39.0–52.0)
Hemoglobin: 15.4 g/dL (ref 13.0–17.0)
LYMPHS ABS: 2.6 10*3/uL (ref 0.7–4.0)
Lymphocytes Relative: 42 % (ref 12.0–46.0)
MCHC: 33.8 g/dL (ref 30.0–36.0)
MCV: 94.8 fl (ref 78.0–100.0)
MONO ABS: 0.7 10*3/uL (ref 0.1–1.0)
Monocytes Relative: 11.7 % (ref 3.0–12.0)
NEUTROS PCT: 44.1 % (ref 43.0–77.0)
Neutro Abs: 2.7 10*3/uL (ref 1.4–7.7)
Platelets: 230 10*3/uL (ref 150.0–400.0)
RBC: 4.81 Mil/uL (ref 4.22–5.81)
RDW: 12.7 % (ref 11.5–15.5)
WBC: 6.2 10*3/uL (ref 4.0–10.5)

## 2018-02-25 LAB — HEPATIC FUNCTION PANEL
ALBUMIN: 4.2 g/dL (ref 3.5–5.2)
ALK PHOS: 106 U/L (ref 39–117)
ALT: 29 U/L (ref 0–53)
AST: 22 U/L (ref 0–37)
Bilirubin, Direct: 0.3 mg/dL (ref 0.0–0.3)
Total Bilirubin: 3.1 mg/dL — ABNORMAL HIGH (ref 0.2–1.2)
Total Protein: 6.5 g/dL (ref 6.0–8.3)

## 2018-02-25 LAB — BASIC METABOLIC PANEL
BUN: 15 mg/dL (ref 6–23)
CO2: 28 mEq/L (ref 19–32)
Calcium: 9.2 mg/dL (ref 8.4–10.5)
Chloride: 102 mEq/L (ref 96–112)
Creatinine, Ser: 1.08 mg/dL (ref 0.40–1.50)
GFR: 74.57 mL/min (ref >60.00)
GLUCOSE: 86 mg/dL (ref 70–99)
POTASSIUM: 4.1 meq/L (ref 3.5–5.1)
Sodium: 138 mEq/L (ref 135–145)

## 2018-02-25 LAB — PSA: PSA: 0.61 ng/mL (ref 0.10–4.00)

## 2018-02-25 LAB — TSH: TSH: 3.69 u[IU]/mL (ref 0.35–4.50)

## 2018-02-25 NOTE — Progress Notes (Signed)
   Subjective:    Patient ID: Juan Oneill, male    DOB: 1960/12/03, 57 y.o.   MRN: [id number]  HPI CPE- UTD on colonoscopy, Tdap, flu.  Pt has gained 5 lbs since last visit   Review of Systems Patient reports no vision/hearing changes, anorexia, fever ,adenopathy, persistant/recurrent hoarseness, swallowing issues, chest pain, palpitations, edema, persistant/recurrent cough, hemoptysis, dyspnea (rest,exertional, paroxysmal nocturnal), gastrointestinal  bleeding (melena, rectal bleeding), abdominal pain, excessive heart burn, GU symptoms (dysuria, hematuria, voiding/incontinence issues) syncope, focal weakness, memory loss, numbness & tingling, skin/hair/nail changes, depression, anxiety, abnormal bruising/bleeding.   + knee pain- medial R knee pain.  Pt reports sxs for at least a year.  Pain most notable w/ full extension.  No pain while exercising at the gym.  Wears patella strap w/ some improvement but pain is worsening over the last month.  No relief w/ knee sleeve.  No issues w/ catching, locking, or giving way.  Pt has been to [place] previously    Objective:   Physical Exam BP 124/82   Pulse 76   Temp 98.9 F (37.2 C) (Oral)   Resp 16   Ht 6\' 1"  (1.854 m)   Wt 191 lb 8 oz (86.9 kg)   SpO2 98%   BMI 25.27 kg/m   General Appearance:    Alert, cooperative, no distress, appears stated age  Head:    Normocephalic, without obvious abnormality, atraumatic  Eyes:    PERRL, conjunctiva/corneas clear, EOM's intact, fundi    benign, both eyes       Ears:    Normal TM's and external ear canals, both ears  Nose:   Nares normal, septum midline, mucosa normal, no drainage   or sinus tenderness  Throat:   Lips, mucosa, and tongue normal; teeth and gums normal  Neck:   Supple, symmetrical, trachea midline, no adenopathy;       thyroid:  No enlargement/tenderness/nodules  Back:     Symmetric, no curvature, ROM normal, no CVA tenderness  Lungs:     Clear to auscultation bilaterally,  respirations unlabored  Chest wall:    No tenderness or deformity  Heart:    Regular rate and rhythm, S1 and S2 normal, no murmur, rub   or gallop  Abdomen:     Soft, non-tender, bowel sounds active all four quadrants,    no masses, no organomegaly  Genitalia:    Normal male without lesion, discharge or tenderness  Rectal:    Normal tone, normal prostate, no masses or tenderness  Extremities:   Extremities normal, atraumatic, no cyanosis or edema  Pulses:   2+ and symmetric all extremities  Skin:   Skin color, texture, turgor normal, no rashes or lesions  Lymph nodes:   Cervical, supraclavicular, and axillary nodes normal  Neurologic:   CNII-XII intact. Normal strength, sensation and reflexes      throughout          Assessment & Plan:

## 2018-02-25 NOTE — Patient Instructions (Addendum)
Follow up in 1 year or as needed We'll notify you of your lab results and make any changes if needed We'll call you with your Ortho appt Continue to work on healthy diet and regular exercise- you can do it! Call with any questions or concerns Happy New Year!!

## 2018-02-25 NOTE — Assessment & Plan Note (Signed)
Pt's PE WNL w/ exception of being mildly overweight.  UTD on colonoscopy, immunizations.  Check labs.  Anticipatory guidance provided.

## 2018-04-22 ENCOUNTER — Encounter: Payer: Self-pay | Admitting: Family Medicine

## 2018-09-10 ENCOUNTER — Telehealth: Payer: Self-pay | Admitting: General Practice

## 2018-09-10 NOTE — Telephone Encounter (Signed)
Pt came into the office today. He would like a note stating that it is ok for him to continue working out with his personal trainer 4 days a week for 45 minutes each session.   Please send to pt in mychart.

## 2018-09-11 ENCOUNTER — Encounter: Payer: Self-pay | Admitting: *Deleted

## 2018-09-11 ENCOUNTER — Encounter: Payer: Self-pay | Admitting: Family Medicine

## 2018-09-11 NOTE — Telephone Encounter (Signed)
Letter was written and sent on mychart for pt to print at home.

## 2018-09-11 NOTE — Telephone Encounter (Signed)
Ok to provide note

## 2018-09-30 IMAGING — CR DG FINGER RING 2+V*L*
3 series · 3 of 3 positions shown · non-contrast
Comparison: None.

CLINICAL DATA: Laceration left fourth finger, finger injury

EXAM:
LEFT RING FINGER 2+V

[x finger pa left]
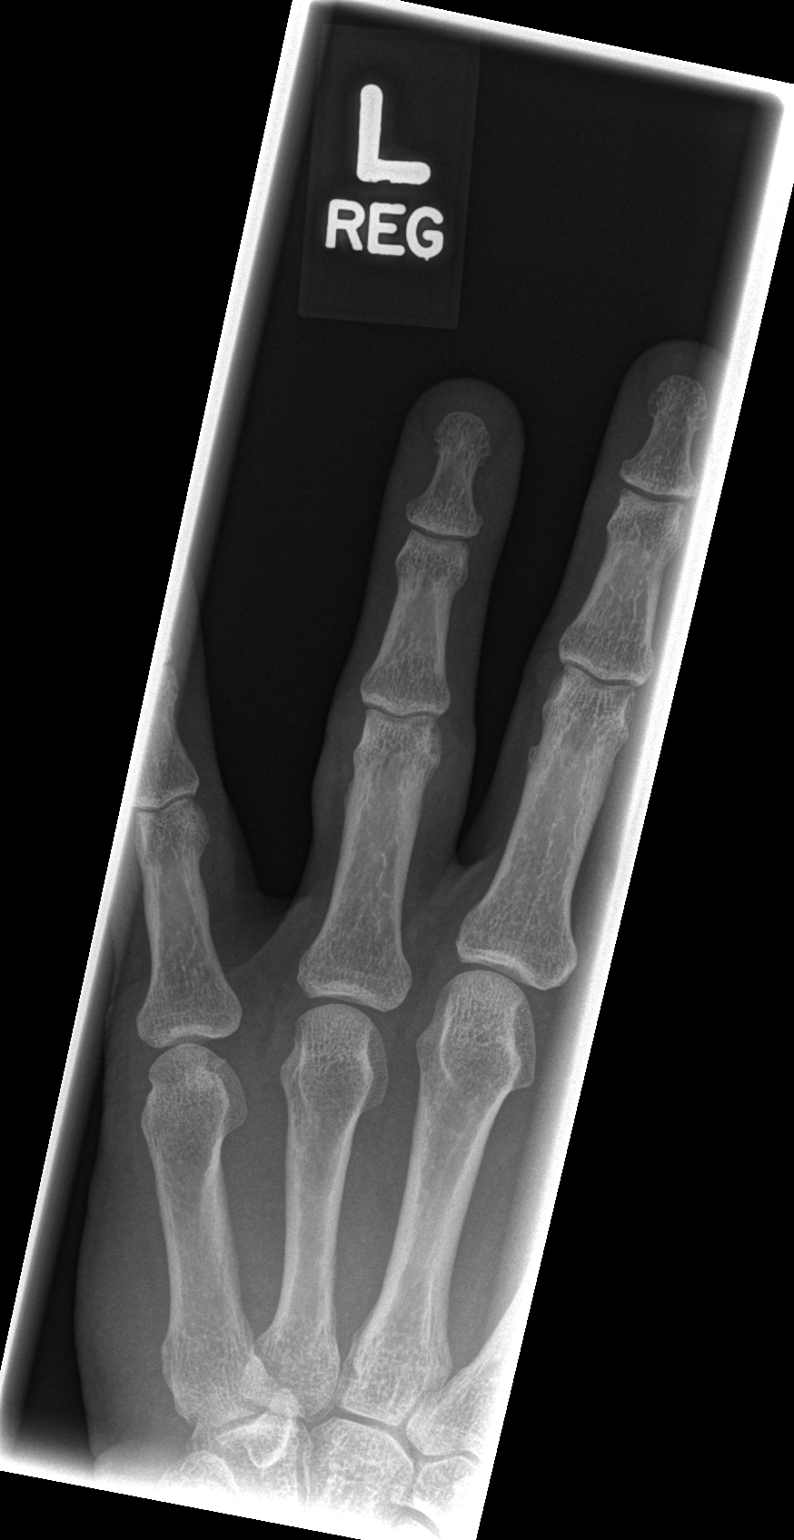

[x finger obl. left]
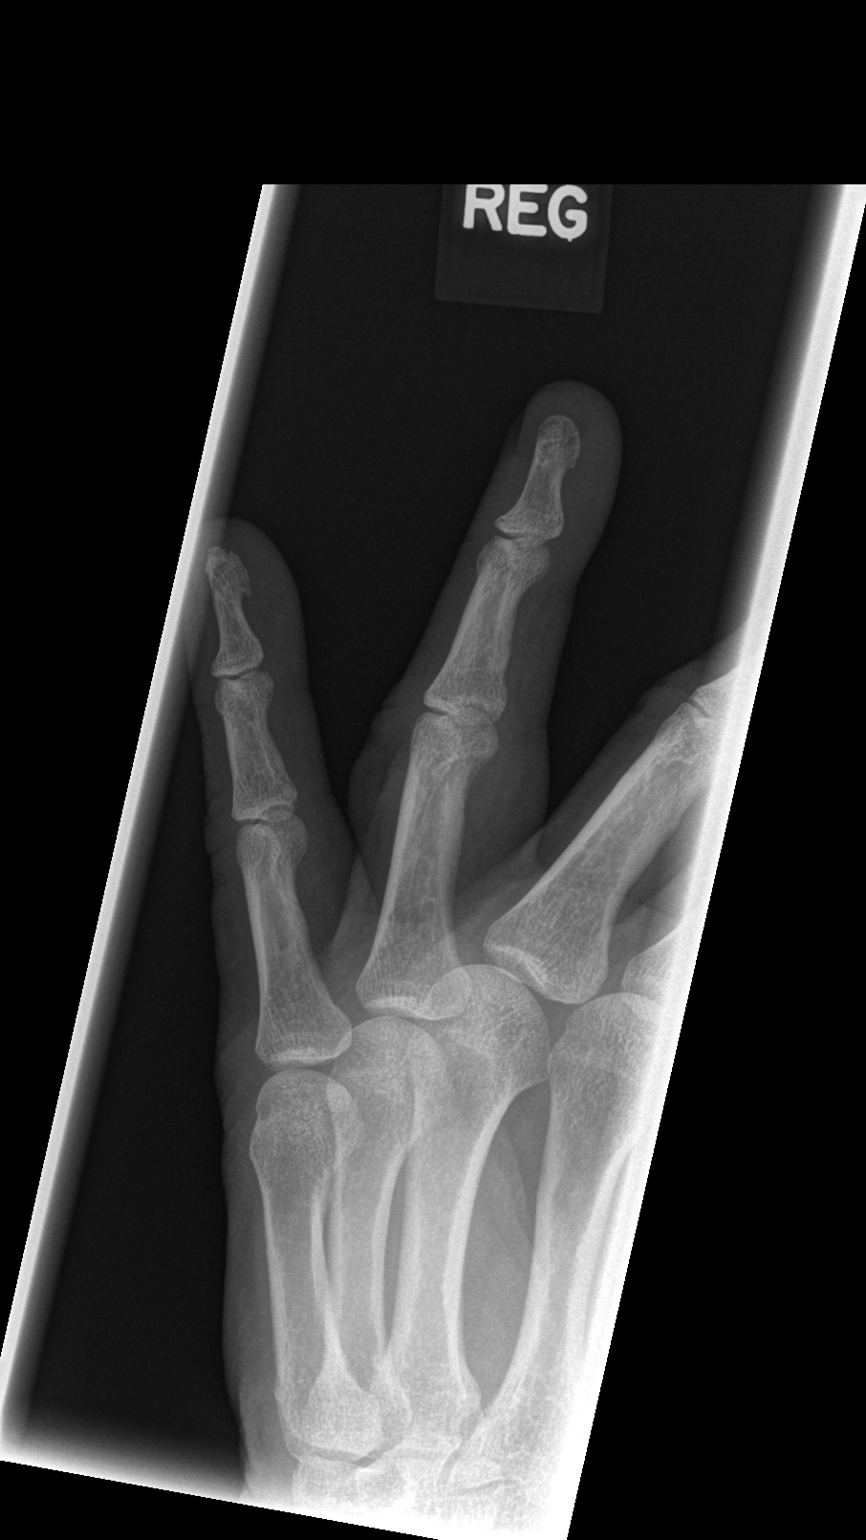

[x finger lateral left]
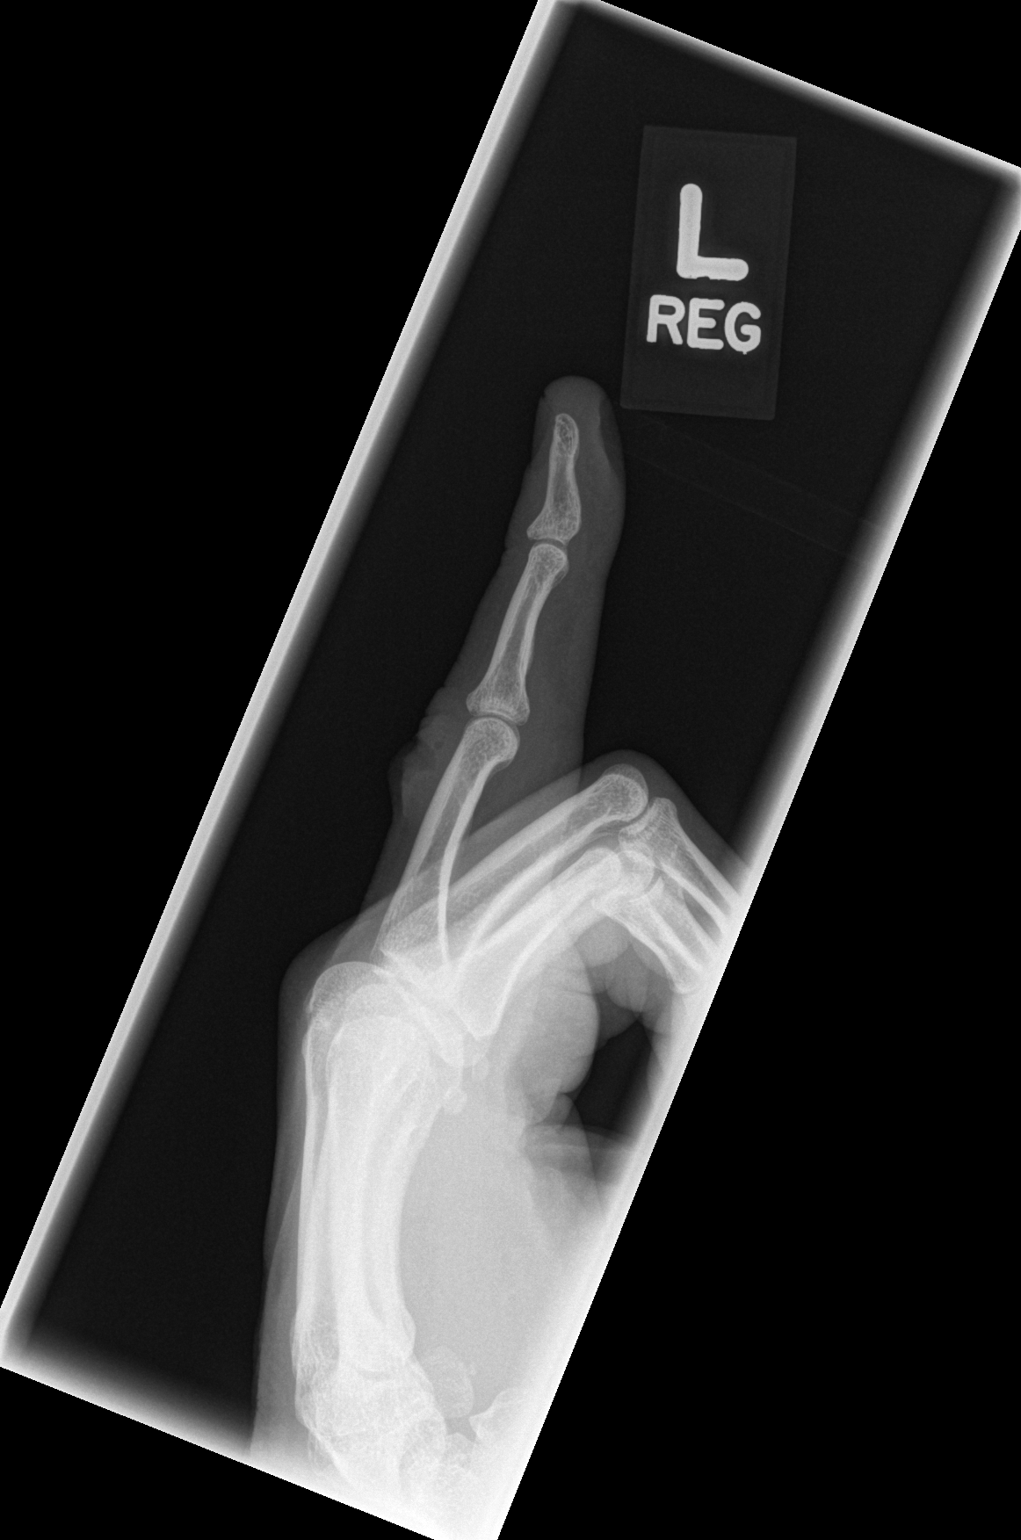

[3 of 3 positions shown; findings below may reference images not displayed]

FINDINGS: Three views of the left fourth finger submitted. No acute fracture
or subluxation. There is soft tissue/skin irregularity dorsal aspect
just above the proximal interphalangeal joint. Best seen on lateral
view.
IMPRESSION: No acute fracture or subluxation. There is soft tissue/skin
irregularity dorsal aspect just above the proximal interphalangeal
joint. Best seen on lateral view.

## 2018-12-25 ENCOUNTER — Other Ambulatory Visit: Payer: Self-pay

## 2018-12-25 ENCOUNTER — Ambulatory Visit (INDEPENDENT_AMBULATORY_CARE_PROVIDER_SITE_OTHER): Payer: BC Managed Care – PPO

## 2018-12-25 DIAGNOSIS — Z23 Encounter for immunization: Secondary | ICD-10-CM

## 2019-02-27 ENCOUNTER — Encounter: Payer: BC Managed Care – PPO | Admitting: Family Medicine

## 2019-03-04 ENCOUNTER — Other Ambulatory Visit: Payer: Self-pay

## 2019-03-04 ENCOUNTER — Encounter: Payer: Self-pay | Admitting: Family Medicine

## 2019-03-04 ENCOUNTER — Ambulatory Visit: Payer: BC Managed Care – PPO

## 2019-03-04 ENCOUNTER — Ambulatory Visit (INDEPENDENT_AMBULATORY_CARE_PROVIDER_SITE_OTHER): Payer: BC Managed Care – PPO | Admitting: Family Medicine

## 2019-03-04 VITALS — Ht 72.0 in | Wt 188.3 lb

## 2019-03-04 DIAGNOSIS — N529 Male erectile dysfunction, unspecified: Secondary | ICD-10-CM | POA: Diagnosis not present

## 2019-03-04 DIAGNOSIS — E663 Overweight: Secondary | ICD-10-CM

## 2019-03-04 DIAGNOSIS — Z0000 Encounter for general adult medical examination without abnormal findings: Secondary | ICD-10-CM | POA: Diagnosis not present

## 2019-03-04 DIAGNOSIS — Z125 Encounter for screening for malignant neoplasm of prostate: Secondary | ICD-10-CM | POA: Diagnosis not present

## 2019-03-04 MED ORDER — SILDENAFIL CITRATE 100 MG PO TABS: 50.0000 mg | ORAL_TABLET | Freq: Every day | ORAL | 11 refills | Status: DC | PRN

## 2019-03-04 NOTE — Progress Notes (Signed)
   Virtual Visit via Video   I connected with patient on 03/04/19 at  8:00 AM EST by a video enabled telemedicine application and verified that I am speaking with the correct person using two identifiers.  Location patient: Home Location provider: [facility], Office Persons participating in the virtual visit: Patient, Provider, [place] (Jess B)  I discussed the limitations of evaluation and management by telemedicine and the availability of in person appointments. The patient expressed understanding and agreed to proceed.  Subjective:   HPI:   CPE- UTD on colonoscopy, Tdap, flu.  No concerns today w/ exception   ED- pt would like to start an as needed script and prefers to 'be discrete'.  Would like to pick up written script when he comes for blood work.    ROS:  Patient reports no vision/hearing changes, anorexia, fever ,adenopathy, persistant/recurrent hoarseness, swallowing issues, chest pain, palpitations, edema, persistant/recurrent cough, hemoptysis, dyspnea (rest,exertional, paroxysmal nocturnal), gastrointestinal  bleeding (melena, rectal bleeding), abdominal pain, excessive heart burn, GU symptoms (dysuria, hematuria, voiding/incontinence issues) syncope, focal weakness, memory loss, numbness & tingling, skin/hair/nail changes, depression, anxiety, abnormal bruising/bleeding, musculoskeletal symptoms/signs.   Patient Active Problem List   Diagnosis Date Noted  . Physical exam 02/25/2018  . Vertigo 12/05/2017  . Internal hemorrhoid 02/22/2017  . Adenomatous polyp of colon 02/22/2017  . 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 Smoking status: Never Smoker  . Smokeless tobacco: Never Used  Substance Use Topics  . Alcohol use: Yes    Alcohol/week: 2.0 standard drinks    Types: 2 Cans of beer per week    Current Outpatient Medications:  .  prednisoLONE acetate (PRED FORTE) 1 % ophthalmic suspension, INSTILL 1 DROP 3 TIMES A DAY FOR 1 WEEK,  TWICE A DAY FOR 2 WEEKS, THEN ONCE A DAY FOR 2 WEEKS, Disp: , Rfl:   No Known Allergies  Objective:   Ht 6' (1.829 m)   Wt 188 lb 5 oz (85.4 kg)   BMI 25.54 kg/m  AAOx3, NAD NCAT, EOMI No obvious CN deficits Coloring WNL Pt is able to speak clearly, coherently without shortness of breath or increased work of breathing.  Thought process is linear.  Mood is appropriate.   Assessment and Plan:   CPE- UTD on immunizations and colonoscopy.  Limited video PE WNL.  Pt to come for labs this afternoon.  Anticipatory guidance provided.   ED- new.  Pt is asking to try Viagra as needed to improve sxs.  Prescription printed.  Will follow.  Annye Asa, MD 03/04/2019

## 2019-03-04 NOTE — Progress Notes (Signed)
I have discussed the procedure for the virtual visit with the patient who has given consent to proceed with assessment and treatment.   Slyvia Lartigue L Richanda Darin, CMA     

## 2019-03-05 ENCOUNTER — Encounter: Payer: Self-pay | Admitting: Family Medicine

## 2019-03-05 ENCOUNTER — Other Ambulatory Visit: Payer: Self-pay

## 2019-03-05 ENCOUNTER — Ambulatory Visit (INDEPENDENT_AMBULATORY_CARE_PROVIDER_SITE_OTHER): Payer: BC Managed Care – PPO

## 2019-03-05 DIAGNOSIS — Z125 Encounter for screening for malignant neoplasm of prostate: Secondary | ICD-10-CM | POA: Diagnosis not present

## 2019-03-05 DIAGNOSIS — E663 Overweight: Secondary | ICD-10-CM

## 2019-03-05 LAB — BASIC METABOLIC PANEL
BUN: 20 mg/dL (ref 6–23)
CO2: 29 mEq/L (ref 19–32)
Calcium: 9.3 mg/dL (ref 8.4–10.5)
Chloride: 103 mEq/L (ref 96–112)
Creatinine, Ser: 1.1 mg/dL (ref 0.40–1.50)
GFR: 68.45 mL/min (ref >60.00)
Glucose, Bld: 96 mg/dL (ref 70–99)
Potassium: 3.9 mEq/L (ref 3.5–5.1)
Sodium: 138 mEq/L (ref 135–145)

## 2019-03-05 LAB — PSA: PSA: 0.78 ng/mL (ref 0.10–4.00)

## 2019-03-05 LAB — LIPID PANEL
Cholesterol: 187 mg/dL (ref 0–200)
HDL: 37.2 mg/dL — ABNORMAL LOW (ref >39.00)
NonHDL: 150.19
Total CHOL/HDL Ratio: 5
Triglycerides: 292 mg/dL — ABNORMAL HIGH (ref 0.0–149.0)
VLDL: 58.4 mg/dL — ABNORMAL HIGH (ref 0.0–40.0)

## 2019-03-05 LAB — CBC WITH DIFFERENTIAL/PLATELET
Basophils Absolute: 0 10*3/uL (ref 0.0–0.1)
Basophils Relative: 0.6 % (ref 0.0–3.0)
Eosinophils Absolute: 0.1 10*3/uL (ref 0.0–0.7)
Eosinophils Relative: 2.1 % (ref 0.0–5.0)
HCT: 46.3 % (ref 39.0–52.0)
Hemoglobin: 15.5 g/dL (ref 13.0–17.0)
Lymphocytes Relative: 42.2 % (ref 12.0–46.0)
Lymphs Abs: 2.9 10*3/uL (ref 0.7–4.0)
MCHC: 33.5 g/dL (ref 30.0–36.0)
MCV: 96.3 fl (ref 78.0–100.0)
Monocytes Absolute: 0.9 10*3/uL (ref 0.1–1.0)
Monocytes Relative: 13 % — ABNORMAL HIGH (ref 3.0–12.0)
Neutro Abs: 2.9 10*3/uL (ref 1.4–7.7)
Neutrophils Relative %: 42.1 % — ABNORMAL LOW (ref 43.0–77.0)
Platelets: 241 10*3/uL (ref 150.0–400.0)
RBC: 4.81 Mil/uL (ref 4.22–5.81)
RDW: 12.6 % (ref 11.5–15.5)
WBC: 6.9 10*3/uL (ref 4.0–10.5)

## 2019-03-05 LAB — TSH: TSH: 5.1 u[IU]/mL — ABNORMAL HIGH (ref 0.35–4.50)

## 2019-03-05 LAB — HEPATIC FUNCTION PANEL
ALT: 39 U/L (ref 0–53)
AST: 23 U/L (ref 0–37)
Albumin: 4.2 g/dL (ref 3.5–5.2)
Alkaline Phosphatase: 121 U/L — ABNORMAL HIGH (ref 39–117)
Bilirubin, Direct: 0.2 mg/dL (ref 0.0–0.3)
Total Bilirubin: 1.2 mg/dL (ref 0.2–1.2)
Total Protein: 6.6 g/dL (ref 6.0–8.3)

## 2019-03-05 LAB — LDL CHOLESTEROL, DIRECT: Direct LDL: 100 mg/dL

## 2019-03-06 ENCOUNTER — Other Ambulatory Visit: Payer: Self-pay | Admitting: General Practice

## 2019-03-06 DIAGNOSIS — E039 Hypothyroidism, unspecified: Secondary | ICD-10-CM

## 2019-03-06 MED ORDER — LEVOTHYROXINE SODIUM 50 MCG PO TABS: 50.0000 ug | ORAL_TABLET | Freq: Every day | ORAL | 6 refills | Status: DC

## 2019-04-09 ENCOUNTER — Ambulatory Visit (INDEPENDENT_AMBULATORY_CARE_PROVIDER_SITE_OTHER): Payer: BC Managed Care – PPO | Admitting: *Deleted

## 2019-04-09 ENCOUNTER — Other Ambulatory Visit: Payer: Self-pay

## 2019-04-09 DIAGNOSIS — E039 Hypothyroidism, unspecified: Secondary | ICD-10-CM | POA: Diagnosis not present

## 2019-04-10 ENCOUNTER — Encounter: Payer: Self-pay | Admitting: Family Medicine

## 2019-04-10 LAB — TSH: TSH: 1.87 u[IU]/mL (ref 0.35–4.50)

## 2019-06-06 ENCOUNTER — Other Ambulatory Visit: Payer: Self-pay | Admitting: Family Medicine

## 2019-07-23 ENCOUNTER — Encounter: Payer: Self-pay | Admitting: Physician Assistant

## 2019-07-23 ENCOUNTER — Ambulatory Visit: Payer: BC Managed Care – PPO | Admitting: Physician Assistant

## 2019-07-23 ENCOUNTER — Other Ambulatory Visit: Payer: Self-pay

## 2019-07-23 VITALS — BP 110/70 | HR 79 | Temp 98.3°F | Resp 16 | Ht 72.0 in | Wt 194.0 lb

## 2019-07-23 DIAGNOSIS — R5383 Other fatigue: Secondary | ICD-10-CM

## 2019-07-23 DIAGNOSIS — W57XXXA Bitten or stung by nonvenomous insect and other nonvenomous arthropods, initial encounter: Secondary | ICD-10-CM

## 2019-07-23 NOTE — Progress Notes (Signed)
Patient presents to clinic today c/o  2 days of fatigue, decreased appetite and intermittent nausea. Denies fever, chills, URI symptoms, vomiting, abdominal pain, rash. Did work outside in [place] all day this weekend prior to symptom onset. Did pull a tick off of him yesterday, could have only been present 24 hours per patient. Has history of lyme disease in the past which was treated. Is questioning if he could have this again. Also has history of hypothyroidism, currently on a regimen of 50 mcg levothyroxine daily. Endorses taking as directed without fail. Denies change to bowel habits or heat tolerance.   Lab Results  Component Value Date   TSH 1.87 04/09/2019    Past Medical History:  Diagnosis Date  . Arthritis    GSo orto Dx with arthritis    Current Outpatient Medications on File Prior to Visit  Medication Sig Dispense Refill  . levothyroxine (SYNTHROID) 50 MCG tablet TAKE 1 TABLET BY MOUTH EVERY DAY 90 tablet 1  . sildenafil (VIAGRA) 100 MG tablet Take 0.5-1 tablets (50-100 mg total) by mouth daily as needed for erectile dysfunction. 10 tablet 11   No current facility-administered medications on file prior to visit.    No Known Allergies  Family History  Problem Relation Age of Onset  . Healthy Mother   . Cancer Paternal Grandmother        colon  . Diabetes Paternal Grandmother     Social History   Socioeconomic History  . Marital status: Married    Spouse name: Not on file  . Number of children: Not on file  . Years of education: Not on file  . Highest education level: Not on file  Occupational History  . Not on file  Tobacco Use  . Smoking status: Never Smoker  . Smokeless tobacco: Never Used  Substance and Sexual Activity  . Alcohol use: Yes    Alcohol/week: 2.0 standard drinks    Types: 2 Cans of beer per week  . Drug use: No  . Sexual activity: Not on file  Other Topics Concern  . Not on file  Social History Narrative  . Not on file    Social Determinants of Health   Financial Resource Strain:   . Difficulty of Paying Living Expenses:   Food Insecurity:   . Worried About Charity fundraiser in the Last Year:   . Arboriculturist in the Last Year:   Transportation Needs:   . Film/video editor (Medical):   Marland Kitchen Lack of Transportation (Non-Medical):   Physical Activity:   . Days of Exercise per Week:   . Minutes of Exercise per Session:   Stress:   . Feeling of Stress :   Social Connections:   . Frequency of Communication with Friends and Family:   . Frequency of Social Gatherings with Friends and Family:   . Attends Religious Services:   . Active Member of Clubs or Organizations:   . Attends Archivist Meetings:   Marland Kitchen Marital Status:    Review of Systems - See HPI.  All other ROS are negative.  BP 110/70   Pulse 79   Temp 98.3 F (36.8 C) (Temporal)   Resp 16   Ht 6' (1.829 m)   Wt 194 lb (88 kg)   SpO2 99%   BMI 26.31 kg/m   Physical Exam Vitals reviewed.  Constitutional:      Appearance: Normal appearance.  HENT:     Head: Normocephalic  and atraumatic.  Eyes:     Conjunctiva/sclera: Conjunctivae normal.  Cardiovascular:     Rate and Rhythm: Normal rate and regular rhythm.     Pulses: Normal pulses.     Heart sounds: Normal heart sounds.  Pulmonary:     Effort: Pulmonary effort is normal.     Breath sounds: Normal breath sounds.  Musculoskeletal:     Cervical back: Neck supple.  Neurological:     General: No focal deficit present.     Mental Status: He is alert and oriented to person, place, and time.  Psychiatric:        Mood and Affect: Mood normal.    Assessment/Plan: 1. Tick bite, initial encounter Of R lower side. No evidence of infection at present. Will check lyme titers today to assess. Will initiate doxycycline if indicated by serology.  - B. burgdorfi antibodies  2. Fatigue, unspecified type Unspecified. Could be related to hypothyroidism. Also needs assessment  for Lyme giving history of tick bite Will also check CBC, CMP, ESR, TSH and Vitamin D.  - CBC with Differential/Platelet - Comprehensive metabolic panel - TSH - Vitamin D (25 hydroxy) - Sedimentation rate - T4, free  This visit occurred during the SARS-CoV-2 public health emergency.  Safety protocols were in place, including screening questions prior to the visit, additional usage of staff PPE, and extensive cleaning of exam room while observing appropriate contact time as indicated for disinfecting solutions.     Leeanne Rio, PA-C

## 2019-07-23 NOTE — Patient Instructions (Signed)
Please go to the lab today for blood work.  I will call you with your results. We will alter treatment regimen(s) if indicated by your results.   Please keep well-hydrated and get plenty of rest. Keep skin clean and dry.  Continue chronic medications as directed.  I should have lab results in 12-48 hours.  Please let me know if you note any new symptoms while we are awaiting results.   Since there is no fever, infected tick bite or rash, will hold off on antibiotic until we get this results.

## 2019-07-24 LAB — CBC WITH DIFFERENTIAL/PLATELET
Basophils Absolute: 0 10*3/uL (ref 0.0–0.1)
Basophils Relative: 0.7 % (ref 0.0–3.0)
Eosinophils Absolute: 0.1 10*3/uL (ref 0.0–0.7)
Eosinophils Relative: 1.6 % (ref 0.0–5.0)
HCT: 42.6 % (ref 39.0–52.0)
Hemoglobin: 14.8 g/dL (ref 13.0–17.0)
Lymphocytes Relative: 35.2 % (ref 12.0–46.0)
Lymphs Abs: 1.9 10*3/uL (ref 0.7–4.0)
MCHC: 34.7 g/dL (ref 30.0–36.0)
MCV: 94.8 fl (ref 78.0–100.0)
Monocytes Absolute: 0.6 10*3/uL (ref 0.1–1.0)
Monocytes Relative: 11.1 % (ref 3.0–12.0)
Neutro Abs: 2.9 10*3/uL (ref 1.4–7.7)
Neutrophils Relative %: 51.4 % (ref 43.0–77.0)
Platelets: 225 10*3/uL (ref 150.0–400.0)
RBC: 4.5 Mil/uL (ref 4.22–5.81)
RDW: 12.9 % (ref 11.5–15.5)
WBC: 5.5 10*3/uL (ref 4.0–10.5)

## 2019-07-24 LAB — COMPREHENSIVE METABOLIC PANEL
ALT: 32 U/L (ref 0–53)
AST: 24 U/L (ref 0–37)
Albumin: 4.1 g/dL (ref 3.5–5.2)
Alkaline Phosphatase: 109 U/L (ref 39–117)
BUN: 16 mg/dL (ref 6–23)
CO2: 27 mEq/L (ref 19–32)
Calcium: 8.9 mg/dL (ref 8.4–10.5)
Chloride: 105 mEq/L (ref 96–112)
Creatinine, Ser: 0.9 mg/dL (ref 0.40–1.50)
GFR: 86.17 mL/min (ref >60.00)
Glucose, Bld: 104 mg/dL — ABNORMAL HIGH (ref 70–99)
Potassium: 4.2 mEq/L (ref 3.5–5.1)
Sodium: 137 mEq/L (ref 135–145)
Total Bilirubin: 1.9 mg/dL — ABNORMAL HIGH (ref 0.2–1.2)
Total Protein: 6.1 g/dL (ref 6.0–8.3)

## 2019-07-24 LAB — B. BURGDORFI ANTIBODIES: B burgdorferi Ab IgG+IgM: <0.9 index

## 2019-07-24 LAB — SEDIMENTATION RATE: Sed Rate: 1 mm/hr (ref 0–20)

## 2019-07-24 LAB — TSH: TSH: 1.58 u[IU]/mL (ref 0.35–4.50)

## 2019-07-24 LAB — T4, FREE: Free T4: 0.8 ng/dL (ref 0.60–1.60)

## 2019-07-24 LAB — VITAMIN D 25 HYDROXY (VIT D DEFICIENCY, FRACTURES): VITD: 24.04 ng/mL — ABNORMAL LOW (ref 30.00–100.00)

## 2019-07-25 ENCOUNTER — Other Ambulatory Visit: Payer: Self-pay | Admitting: Emergency Medicine

## 2019-07-25 DIAGNOSIS — E559 Vitamin D deficiency, unspecified: Secondary | ICD-10-CM

## 2019-07-25 MED ORDER — VITAMIN D (ERGOCALCIFEROL) 1.25 MG (50000 UNIT) PO CAPS: ORAL_CAPSULE | ORAL | 2 refills | Status: DC

## 2019-12-03 ENCOUNTER — Ambulatory Visit (INDEPENDENT_AMBULATORY_CARE_PROVIDER_SITE_OTHER): Payer: BC Managed Care – PPO | Admitting: Emergency Medicine

## 2019-12-03 ENCOUNTER — Other Ambulatory Visit: Payer: Self-pay

## 2019-12-03 DIAGNOSIS — Z23 Encounter for immunization: Secondary | ICD-10-CM | POA: Diagnosis not present

## 2019-12-03 NOTE — Progress Notes (Signed)
Juan Oneill is a 59 y.o. male presents to the office today for Influenza vaccine, per physician's orders. Original order: Influenza vaccine Fluarix (med), 0.5 ml (dose),  IM (route) was administered Left deltoid (location) today. Patient tolerated injection.   Leonidas Romberg

## 2019-12-11 ENCOUNTER — Other Ambulatory Visit: Payer: Self-pay | Admitting: Family Medicine

## 2020-03-05 ENCOUNTER — Ambulatory Visit (INDEPENDENT_AMBULATORY_CARE_PROVIDER_SITE_OTHER): Payer: BC Managed Care – PPO | Admitting: Family Medicine

## 2020-03-05 ENCOUNTER — Encounter: Payer: Self-pay | Admitting: Family Medicine

## 2020-03-05 ENCOUNTER — Other Ambulatory Visit: Payer: Self-pay

## 2020-03-05 VITALS — BP 118/70 | HR 71 | Temp 97.8°F | Resp 16 | Ht 72.0 in | Wt 190.8 lb

## 2020-03-05 DIAGNOSIS — R200 Anesthesia of skin: Secondary | ICD-10-CM

## 2020-03-05 DIAGNOSIS — Z125 Encounter for screening for malignant neoplasm of prostate: Secondary | ICD-10-CM | POA: Diagnosis not present

## 2020-03-05 DIAGNOSIS — Z0000 Encounter for general adult medical examination without abnormal findings: Secondary | ICD-10-CM | POA: Diagnosis not present

## 2020-03-05 DIAGNOSIS — E039 Hypothyroidism, unspecified: Secondary | ICD-10-CM | POA: Diagnosis not present

## 2020-03-05 NOTE — Assessment & Plan Note (Signed)
Chronic problem.  Currently asymptomatic on Levothyroxine.  Check labs.  Adjust meds prn  

## 2020-03-05 NOTE — Addendum Note (Signed)
Addended by: Fritz Pickerel on: 03/05/2020 11:36 AM   Modules accepted: Orders

## 2020-03-05 NOTE — Assessment & Plan Note (Signed)
Pt's PE WNL.  UTD on colonoscopy, immunizations.  Check labs.  Anticipatory guidance provided.  

## 2020-03-05 NOTE — Patient Instructions (Signed)
Follow up in 1 year or as needed We'll notify you of your lab results and make any changes if needed Keep up the good work on healthy diet and regular exercise- you look great! Let me know if the numbness changes or worsens and we can refer to neuro at any time for evaluation Call with any questions or concerns Stay Safe!  Stay Healthy! Happy New Year!

## 2020-03-05 NOTE — Progress Notes (Signed)
   Subjective:    Patient ID: Juan Oneill, male    DOB: [birth year], 60 y.o.   MRN: [id number]  HPI CPE- UTD on colonoscopy, flu shot, Tdap, COVID vaccines  Reviewed past medical, surgical, family and social histories.   Patient Care Team    Relationship Specialty Notifications Start End  Midge Minium, MD PCP - General Family Medicine  12/22/15   Specialists, Digestive Health  Gastroenterology  02/22/17     Health Maintenance  Topic Date Due  . HIV Screening  03/05/2021 (Originally 11/17/1975)  . COLONOSCOPY (Pts 45-62yrs Insurance coverage will need to be confirmed)  08/27/2021  . TETANUS/TDAP  12/21/2025  . INFLUENZA VACCINE  Completed  . COVID-19 Vaccine  Completed  . Hepatitis C Screening  Completed      Review of Systems Patient reports no vision/hearing changes, anorexia, fever ,adenopathy, persistant/recurrent hoarseness, swallowing issues, chest pain, palpitations, edema, persistant/recurrent cough, hemoptysis, dyspnea (rest,exertional, paroxysmal nocturnal), gastrointestinal  bleeding (melena, rectal bleeding), abdominal pain, excessive heart burn, GU symptoms (dysuria, hematuria, voiding/incontinence issues) syncope, focal weakness, memory loss, skin/hair/nail changes, depression, anxiety, abnormal bruising/bleeding, musculoskeletal symptoms/signs.   + numbness of R lower leg- pt had 'a bunch of stitches' just below the area when he was young but the numbness has occurred in the last few years.  Also has numbness/burning of L inferior/anterior knee- most notable when kneeling.  Resolves w/in seconds.  Also notes similar feeling on top of L foot.  This visit occurred during the SARS-CoV-2 public health emergency.  Safety protocols were in place, including screening questions prior to the visit, additional usage of staff PPE, and extensive cleaning of exam room while observing appropriate contact time as indicated for disinfecting solutions.       Objective:    Physical Exam General Appearance:    Alert, cooperative, no distress, appears stated age  Head:    Normocephalic, without obvious abnormality, atraumatic  Eyes:    PERRL, conjunctiva/corneas clear, EOM's intact, fundi    benign, both eyes       Ears:    Normal TM's and external ear canals, both ears  Nose:   Deferred due to COVID  Throat:   Neck:   Supple, symmetrical, trachea midline, no adenopathy;       thyroid:  No enlargement/tenderness/nodules  Back:     Symmetric, no curvature, ROM normal, no CVA tenderness  Lungs:     Clear to auscultation bilaterally, respirations unlabored  Chest wall:    No tenderness or deformity  Heart:    Regular rate and rhythm, S1 and S2 normal, no murmur, rub   or gallop  Abdomen:     Soft, non-tender, bowel sounds active all four quadrants,    no masses, no organomegaly  Genitalia:    deferred  Rectal:    Extremities:   Extremities normal, atraumatic, no cyanosis or edema  Pulses:   2+ and symmetric all extremities  Skin:   Skin color, texture, turgor normal, no rashes or lesions  Lymph nodes:   Cervical, supraclavicular, and axillary nodes normal  Neurologic:   CNII-XII intact. Normal strength, sensation and reflexes      throughout          Assessment & Plan:

## 2020-03-06 LAB — HEPATIC FUNCTION PANEL
AG Ratio: 1.8 (calc) (ref 1.0–2.5)
ALT: 18 U/L (ref 9–46)
AST: 19 U/L (ref 10–35)
Albumin: 4.1 g/dL (ref 3.6–5.1)
Alkaline phosphatase (APISO): 82 U/L (ref 35–144)
Bilirubin, Direct: 0.3 mg/dL — ABNORMAL HIGH (ref 0.0–0.2)
Globulin: 2.3 g/dL (calc) (ref 1.9–3.7)
Indirect Bilirubin: 2.2 mg/dL (calc) — ABNORMAL HIGH (ref 0.2–1.2)
Total Bilirubin: 2.5 mg/dL — ABNORMAL HIGH (ref 0.2–1.2)
Total Protein: 6.4 g/dL (ref 6.1–8.1)

## 2020-03-06 LAB — BASIC METABOLIC PANEL
BUN: 17 mg/dL (ref 7–25)
CO2: 27 mmol/L (ref 20–32)
Calcium: 9.3 mg/dL (ref 8.6–10.3)
Chloride: 106 mmol/L (ref 98–110)
Creat: 1.06 mg/dL (ref 0.70–1.25)
Glucose, Bld: 98 mg/dL (ref 65–99)
Potassium: 4.4 mmol/L (ref 3.5–5.3)
Sodium: 140 mmol/L (ref 135–146)

## 2020-03-06 LAB — CBC WITH DIFFERENTIAL/PLATELET
Absolute Monocytes: 673 cells/uL (ref 200–950)
Basophils Absolute: 18 cells/uL (ref 0–200)
Basophils Relative: 0.3 %
Eosinophils Absolute: 112 cells/uL (ref 15–500)
Eosinophils Relative: 1.9 %
HCT: 44.2 % (ref 38.5–50.0)
Hemoglobin: 15.1 g/dL (ref 13.2–17.1)
Lymphs Abs: 2484 cells/uL (ref 850–3900)
MCH: 32.3 pg (ref 27.0–33.0)
MCHC: 34.2 g/dL (ref 32.0–36.0)
MCV: 94.4 fL (ref 80.0–100.0)
MPV: 10.6 fL (ref 7.5–12.5)
Monocytes Relative: 11.4 %
Neutro Abs: 2614 cells/uL (ref 1500–7800)
Neutrophils Relative %: 44.3 %
Platelets: 247 10*3/uL (ref 140–400)
RBC: 4.68 10*6/uL (ref 4.20–5.80)
RDW: 12 % (ref 11.0–15.0)
Total Lymphocyte: 42.1 %
WBC: 5.9 10*3/uL (ref 3.8–10.8)

## 2020-03-06 LAB — LIPID PANEL
Cholesterol: 176 mg/dL (ref <200)
HDL: 45 mg/dL (ref >=40)
LDL Cholesterol (Calc): 111 mg/dL (calc) — ABNORMAL HIGH
Non-HDL Cholesterol (Calc): 131 mg/dL (calc) — ABNORMAL HIGH (ref <130)
Total CHOL/HDL Ratio: 3.9 (calc) (ref <5.0)
Triglycerides: 96 mg/dL (ref <150)

## 2020-03-06 LAB — TSH: TSH: 2.29 mIU/L (ref 0.40–4.50)

## 2020-03-06 LAB — PSA: PSA: 0.75 ng/mL (ref <=4.0)

## 2020-04-16 ENCOUNTER — Other Ambulatory Visit: Payer: Self-pay

## 2020-04-16 ENCOUNTER — Encounter: Payer: Self-pay | Admitting: Family Medicine

## 2020-04-16 DIAGNOSIS — N529 Male erectile dysfunction, unspecified: Secondary | ICD-10-CM

## 2020-04-16 MED ORDER — SILDENAFIL CITRATE 100 MG PO TABS: 50.0000 mg | ORAL_TABLET | Freq: Every day | ORAL | 11 refills | Status: DC | PRN

## 2020-04-19 MED ORDER — TADALAFIL 20 MG PO TABS: 10.0000 mg | ORAL_TABLET | ORAL | 11 refills | Status: DC | PRN

## 2020-04-19 NOTE — Addendum Note (Signed)
Addended by: Midge Minium on: 04/19/2020 12:33 PM   Modules accepted: Orders

## 2020-06-15 ENCOUNTER — Other Ambulatory Visit: Payer: Self-pay | Admitting: Family Medicine

## 2020-08-25 ENCOUNTER — Encounter: Payer: Self-pay | Admitting: *Deleted

## 2020-09-10 ENCOUNTER — Encounter: Payer: Self-pay | Admitting: Family Medicine

## 2020-09-10 ENCOUNTER — Ambulatory Visit: Payer: BC Managed Care – PPO | Admitting: Family Medicine

## 2020-09-10 ENCOUNTER — Other Ambulatory Visit: Payer: Self-pay

## 2020-09-10 VITALS — BP 100/80 | HR 65 | Temp 97.8°F | Resp 18 | Ht 72.0 in | Wt 184.4 lb

## 2020-09-10 DIAGNOSIS — M545 Low back pain, unspecified: Secondary | ICD-10-CM | POA: Diagnosis not present

## 2020-09-10 DIAGNOSIS — R361 Hematospermia: Secondary | ICD-10-CM

## 2020-09-10 DIAGNOSIS — R109 Unspecified abdominal pain: Secondary | ICD-10-CM

## 2020-09-10 LAB — POCT URINALYSIS DIPSTICK OB
Bilirubin, UA: NEGATIVE
Blood, UA: NEGATIVE
Glucose, UA: NEGATIVE
Ketones, UA: NEGATIVE
Leukocytes, UA: NEGATIVE
Nitrite, UA: NEGATIVE
POC,PROTEIN,UA: NEGATIVE
Spec Grav, UA: 1.015 (ref 1.010–1.025)
Urobilinogen, UA: 0.2 E.U./dL
pH, UA: 6 (ref 5.0–8.0)

## 2020-09-10 NOTE — Patient Instructions (Signed)
Follow up as needed or as scheduled We'll notify you of your urine culture results and treat if needed Drink LOTS of fluids Ibuprofen or Aleve as needed We'll call you with your CT scan appt and your Urology referral Continue to monitor things and if there is again blood in urine or semen- let us know Call with any questions or concerns Have a great weekend!!!

## 2020-09-10 NOTE — Progress Notes (Signed)
Patient has lower back pain. Possible kidney stone

## 2020-09-10 NOTE — Progress Notes (Signed)
   Subjective:    Patient ID: Juan Oneill, male    DOB: 11-30-1960, 60 y.o.   MRN: [id number]  HPI Blood in semen- occurred Monday night.  Painless.  No blood since- not in urine or other penile secretions.  No burning w/ urination.  No frequency or urgency.  Some R sided CVA tenderness (constant over the last few days) w/ intermittent R pelvic pain.  Took 2 Aleve yesterday for discomfort.  No scrotal or testicular pain.  No injury to testicles or penis prior to bloody ejaculation.  No hx of kidney stones  COVID + 6/21 He wonders if this could be a 'weird COVID side effect'   Review of Systems For ROS see HPI   This visit occurred during the SARS-CoV-2 public health emergency.  Safety protocols were in place, including screening questions prior to the visit, additional usage of staff PPE, and extensive cleaning of exam room while observing appropriate contact time as indicated for disinfecting solutions.      Objective:   Physical Exam Vitals reviewed.  Constitutional:      General: He is not in acute distress.    Appearance: Normal appearance. He is not ill-appearing.  HENT:     Head: Normocephalic and atraumatic.  Abdominal:     General: Abdomen is flat.     Palpations: Abdomen is soft. There is no mass.     Tenderness: There is abdominal tenderness (mild TTP over RLQ). There is right CVA tenderness. There is no left CVA tenderness, guarding or rebound.     Hernia: No hernia is present.  Musculoskeletal:     Right lower leg: No edema.     Left lower leg: No edema.  Skin:    General: Skin is warm and dry.  Neurological:     General: No focal deficit present.     Mental Status: He is alert and oriented to person, place, and time.  Psychiatric:        Mood and Affect: Mood normal.        Behavior: Behavior normal.        Thought Content: Thought content normal.          Assessment & Plan:   Blood in semen/R flank pain- new.  Pt had frank blood after ejaculation on  Monday night.  'it was scary'.  Has not see blood in urine and has not ejaculated since Monday.  No pain.  No frequency or urgency.  Does note some R flank pain but wasn't sure if that was muscle strain or related to the blood.  He questions whether he has a kidney stone.  Reviewed [place] from 2012 and no stones seen at that time.  No nausea or vomiting.  Some pain relief w/ ibuprofen.  Declines pain medication.  Check stone CT and refer to urology for complete evaluation while awaiting urine culture.  Pt expressed understanding and is in agreement w/ plan.

## 2020-09-12 ENCOUNTER — Encounter: Payer: Self-pay | Admitting: Family Medicine

## 2020-09-12 LAB — URINE CULTURE
MICRO NUMBER:: 12126698
Result:: NO GROWTH
SPECIMEN QUALITY:: ADEQUATE

## 2020-09-20 ENCOUNTER — Ambulatory Visit (INDEPENDENT_AMBULATORY_CARE_PROVIDER_SITE_OTHER): Payer: BC Managed Care – PPO

## 2020-09-20 ENCOUNTER — Other Ambulatory Visit: Payer: Self-pay

## 2020-09-20 DIAGNOSIS — R109 Unspecified abdominal pain: Secondary | ICD-10-CM | POA: Diagnosis not present

## 2020-09-20 DIAGNOSIS — R361 Hematospermia: Secondary | ICD-10-CM

## 2020-10-01 ENCOUNTER — Encounter: Payer: Self-pay | Admitting: Family Medicine

## 2020-12-18 ENCOUNTER — Other Ambulatory Visit: Payer: Self-pay | Admitting: Family Medicine

## 2021-01-27 ENCOUNTER — Ambulatory Visit: Payer: BC Managed Care – PPO

## 2021-03-07 ENCOUNTER — Encounter: Payer: Self-pay | Admitting: Family Medicine

## 2021-03-07 ENCOUNTER — Ambulatory Visit (INDEPENDENT_AMBULATORY_CARE_PROVIDER_SITE_OTHER): Payer: BC Managed Care – PPO | Admitting: Family Medicine

## 2021-03-07 VITALS — BP 102/68 | HR 66 | Temp 98.6°F | Resp 16 | Ht 72.0 in | Wt 190.8 lb

## 2021-03-07 DIAGNOSIS — Z0000 Encounter for general adult medical examination without abnormal findings: Secondary | ICD-10-CM

## 2021-03-07 DIAGNOSIS — Z23 Encounter for immunization: Secondary | ICD-10-CM

## 2021-03-07 DIAGNOSIS — Z114 Encounter for screening for human immunodeficiency virus [HIV]: Secondary | ICD-10-CM

## 2021-03-07 DIAGNOSIS — E039 Hypothyroidism, unspecified: Secondary | ICD-10-CM | POA: Diagnosis not present

## 2021-03-07 DIAGNOSIS — E559 Vitamin D deficiency, unspecified: Secondary | ICD-10-CM | POA: Diagnosis not present

## 2021-03-07 DIAGNOSIS — Z125 Encounter for screening for malignant neoplasm of prostate: Secondary | ICD-10-CM

## 2021-03-07 LAB — LIPID PANEL
Cholesterol: 194 mg/dL (ref 0–200)
HDL: 47.7 mg/dL (ref >39.00)
LDL Cholesterol: 117 mg/dL — ABNORMAL HIGH (ref 0–99)
NonHDL: 146.57
Total CHOL/HDL Ratio: 4
Triglycerides: 150 mg/dL — ABNORMAL HIGH (ref 0.0–149.0)
VLDL: 30 mg/dL (ref 0.0–40.0)

## 2021-03-07 LAB — CBC WITH DIFFERENTIAL/PLATELET
Basophils Absolute: 0 10*3/uL (ref 0.0–0.1)
Basophils Relative: 0.3 % (ref 0.0–3.0)
Eosinophils Absolute: 0.1 10*3/uL (ref 0.0–0.7)
Eosinophils Relative: 1.8 % (ref 0.0–5.0)
HCT: 45.6 % (ref 39.0–52.0)
Hemoglobin: 15.1 g/dL (ref 13.0–17.0)
Lymphocytes Relative: 41.2 % (ref 12.0–46.0)
Lymphs Abs: 2 10*3/uL (ref 0.7–4.0)
MCHC: 33.1 g/dL (ref 30.0–36.0)
MCV: 96.6 fl (ref 78.0–100.0)
Monocytes Absolute: 0.6 10*3/uL (ref 0.1–1.0)
Monocytes Relative: 11.6 % (ref 3.0–12.0)
Neutro Abs: 2.2 10*3/uL (ref 1.4–7.7)
Neutrophils Relative %: 45.1 % (ref 43.0–77.0)
Platelets: 224 10*3/uL (ref 150.0–400.0)
RBC: 4.72 Mil/uL (ref 4.22–5.81)
RDW: 12.8 % (ref 11.5–15.5)
WBC: 4.8 10*3/uL (ref 4.0–10.5)

## 2021-03-07 LAB — HEPATIC FUNCTION PANEL
ALT: 17 U/L (ref 0–53)
AST: 18 U/L (ref 0–37)
Albumin: 4.2 g/dL (ref 3.5–5.2)
Alkaline Phosphatase: 78 U/L (ref 39–117)
Bilirubin, Direct: 0.3 mg/dL (ref 0.0–0.3)
Total Bilirubin: 2.7 mg/dL — ABNORMAL HIGH (ref 0.2–1.2)
Total Protein: 6.7 g/dL (ref 6.0–8.3)

## 2021-03-07 LAB — BASIC METABOLIC PANEL
BUN: 21 mg/dL (ref 6–23)
CO2: 27 mEq/L (ref 19–32)
Calcium: 9.1 mg/dL (ref 8.4–10.5)
Chloride: 104 mEq/L (ref 96–112)
Creatinine, Ser: 1.05 mg/dL (ref 0.40–1.50)
GFR: 76.72 mL/min (ref >60.00)
Glucose, Bld: 88 mg/dL (ref 70–99)
Potassium: 4.3 mEq/L (ref 3.5–5.1)
Sodium: 139 mEq/L (ref 135–145)

## 2021-03-07 LAB — TSH: TSH: 2.8 u[IU]/mL (ref 0.35–5.50)

## 2021-03-07 LAB — VITAMIN D 25 HYDROXY (VIT D DEFICIENCY, FRACTURES): VITD: 26.75 ng/mL — ABNORMAL LOW (ref 30.00–100.00)

## 2021-03-07 LAB — PSA: PSA: 0.82 ng/mL (ref 0.10–4.00)

## 2021-03-07 NOTE — Progress Notes (Signed)
° °  Subjective:    Patient ID: Juan Oneill, male    DOB: [birth year], 61 y.o.   MRN: [id number]  HPI CPE- UTD on colonoscopy, Tdap.  Will get flu today.  Patient Care Team    Relationship Specialty Notifications Start End  Midge Minium, MD PCP - General Family Medicine  12/22/15   Specialists, Digestive Health  Gastroenterology  02/22/17     Health Maintenance  Topic Date Due   HIV Screening  Never done   Zoster Vaccines- Shingrix (1 of 2) Never done   COVID-19 Vaccine (4 - Booster for Pfizer series) 04/07/2020   INFLUENZA VACCINE  09/27/2020   COLONOSCOPY (Pts 45-31yrs Insurance coverage will need to be confirmed)  08/27/2021   TETANUS/TDAP  12/21/2025   Hepatitis C Screening  Completed   Pneumococcal Vaccine 97-57 Years old  Aged Out   HPV VACCINES  Aged Out      Review of Systems Patient reports no vision/hearing changes, anorexia, fever ,adenopathy, persistant/recurrent hoarseness, swallowing issues, chest pain, palpitations, edema, persistant/recurrent cough, hemoptysis, dyspnea (rest,exertional, paroxysmal nocturnal), gastrointestinal  bleeding (melena, rectal bleeding), abdominal pain, excessive heart burn, GU symptoms (dysuria, hematuria, voiding/incontinence issues) syncope, focal weakness, memory loss, numbness & tingling, skin/hair/nail changes, depression, anxiety, abnormal bruising/bleeding, musculoskeletal symptoms/signs.   +7 lb weight gain  This visit occurred during the SARS-CoV-2 public health emergency.  Safety protocols were in place, including screening questions prior to the visit, additional usage of staff PPE, and extensive cleaning of exam room while observing appropriate contact time as indicated for disinfecting solutions.      Objective:   Physical Exam General Appearance:    Alert, cooperative, no distress, appears stated age  Head:    Normocephalic, without obvious abnormality, atraumatic  Eyes:    PERRL, conjunctiva/corneas clear, EOM's  intact, fundi    benign, both eyes       Ears:    Normal TM's and external ear canals, both ears  Nose:   Deferred due to COVID  Throat:   Neck:   Supple, symmetrical, trachea midline, no adenopathy;       thyroid:  No enlargement/tenderness/nodules  Back:     Symmetric, no curvature, ROM normal, no CVA tenderness  Lungs:     Clear to auscultation bilaterally, respirations unlabored  Chest wall:    No tenderness or deformity  Heart:    Regular rate and rhythm, S1 and S2 normal, no murmur, rub   or gallop  Abdomen:     Soft, non-tender, bowel sounds active all four quadrants,    no masses, no organomegaly  Genitalia:    deferred  Rectal:    Extremities:   Extremities normal, atraumatic, no cyanosis or edema  Pulses:   2+ and symmetric all extremities  Skin:   Skin color, texture, turgor normal, no rashes or lesions  Lymph nodes:   Cervical, supraclavicular, and axillary nodes normal  Neurologic:   CNII-XII intact. Normal strength, sensation and reflexes      throughout          Assessment & Plan:

## 2021-03-07 NOTE — Assessment & Plan Note (Signed)
Pt's PE WNL.  UTD on colonoscopy, Tdap.  Flu shot given today.  Check labs.  Anticipatory guidance provided.  

## 2021-03-07 NOTE — Assessment & Plan Note (Signed)
Chronic problem.  Check labs and adjust meds prn. 

## 2021-03-07 NOTE — Patient Instructions (Addendum)
Follow up in 1 year or as needed Have your wife call to schedule a new patient appt and be sure to say she's with you (established patient) We'll notify you of your lab results and make any changes if needed Continue to work on healthy diet and regular exercise- you're doing great!! Call with any questions or concerns Stay Safe!!  Stay Healthy!

## 2021-03-08 ENCOUNTER — Other Ambulatory Visit: Payer: Self-pay

## 2021-03-08 LAB — HIV ANTIBODY (ROUTINE TESTING W REFLEX): HIV 1&2 Ab, 4th Generation: NONREACTIVE

## 2021-03-08 MED ORDER — VITAMIN D (ERGOCALCIFEROL) 1.25 MG (50000 UNIT) PO CAPS: 50000.0000 [IU] | ORAL_CAPSULE | ORAL | 0 refills | Status: DC

## 2021-06-06 ENCOUNTER — Other Ambulatory Visit: Payer: Self-pay | Admitting: Family Medicine

## 2021-06-15 ENCOUNTER — Other Ambulatory Visit: Payer: Self-pay | Admitting: Family Medicine

## 2021-11-28 LAB — HM COLONOSCOPY

## 2021-12-07 ENCOUNTER — Encounter: Payer: Self-pay | Admitting: Family Medicine

## 2021-12-17 ENCOUNTER — Other Ambulatory Visit: Payer: Self-pay | Admitting: Family Medicine

## 2022-03-09 ENCOUNTER — Ambulatory Visit (INDEPENDENT_AMBULATORY_CARE_PROVIDER_SITE_OTHER): Payer: BC Managed Care – PPO | Admitting: Family Medicine

## 2022-03-09 ENCOUNTER — Encounter: Payer: Self-pay | Admitting: Family Medicine

## 2022-03-09 VITALS — BP 118/78 | HR 71 | Temp 98.8°F | Resp 17 | Ht 72.0 in | Wt 187.5 lb

## 2022-03-09 DIAGNOSIS — Z23 Encounter for immunization: Secondary | ICD-10-CM

## 2022-03-09 DIAGNOSIS — E039 Hypothyroidism, unspecified: Secondary | ICD-10-CM | POA: Diagnosis not present

## 2022-03-09 DIAGNOSIS — Z0000 Encounter for general adult medical examination without abnormal findings: Secondary | ICD-10-CM

## 2022-03-09 DIAGNOSIS — E559 Vitamin D deficiency, unspecified: Secondary | ICD-10-CM | POA: Diagnosis not present

## 2022-03-09 DIAGNOSIS — Z125 Encounter for screening for malignant neoplasm of prostate: Secondary | ICD-10-CM | POA: Diagnosis not present

## 2022-03-09 LAB — CBC WITH DIFFERENTIAL/PLATELET
Basophils Absolute: 0 10*3/uL (ref 0.0–0.1)
Basophils Relative: 0.2 % (ref 0.0–3.0)
Eosinophils Absolute: 0.1 10*3/uL (ref 0.0–0.7)
Eosinophils Relative: 1.3 % (ref 0.0–5.0)
HCT: 45 % (ref 39.0–52.0)
Hemoglobin: 15.3 g/dL (ref 13.0–17.0)
Lymphocytes Relative: 35.2 % (ref 12.0–46.0)
Lymphs Abs: 1.9 10*3/uL (ref 0.7–4.0)
MCHC: 34.1 g/dL (ref 30.0–36.0)
MCV: 95.7 fl (ref 78.0–100.0)
Monocytes Absolute: 0.8 10*3/uL (ref 0.1–1.0)
Monocytes Relative: 14.7 % — ABNORMAL HIGH (ref 3.0–12.0)
Neutro Abs: 2.6 10*3/uL (ref 1.4–7.7)
Neutrophils Relative %: 48.6 % (ref 43.0–77.0)
Platelets: 270 10*3/uL (ref 150.0–400.0)
RBC: 4.7 Mil/uL (ref 4.22–5.81)
RDW: 12.2 % (ref 11.5–15.5)
WBC: 5.3 10*3/uL (ref 4.0–10.5)

## 2022-03-09 LAB — VITAMIN D 25 HYDROXY (VIT D DEFICIENCY, FRACTURES): VITD: 21.9 ng/mL — ABNORMAL LOW (ref 30.00–100.00)

## 2022-03-09 LAB — BASIC METABOLIC PANEL
BUN: 20 mg/dL (ref 6–23)
CO2: 31 mEq/L (ref 19–32)
Calcium: 9.3 mg/dL (ref 8.4–10.5)
Chloride: 105 mEq/L (ref 96–112)
Creatinine, Ser: 1.08 mg/dL (ref 0.40–1.50)
GFR: 73.65 mL/min (ref >60.00)
Glucose, Bld: 82 mg/dL (ref 70–99)
Potassium: 4.6 mEq/L (ref 3.5–5.1)
Sodium: 143 mEq/L (ref 135–145)

## 2022-03-09 LAB — HEPATIC FUNCTION PANEL
ALT: 77 U/L — ABNORMAL HIGH (ref 0–53)
AST: 44 U/L — ABNORMAL HIGH (ref 0–37)
Albumin: 4.1 g/dL (ref 3.5–5.2)
Alkaline Phosphatase: 168 U/L — ABNORMAL HIGH (ref 39–117)
Bilirubin, Direct: 0.2 mg/dL (ref 0.0–0.3)
Total Bilirubin: 1.8 mg/dL — ABNORMAL HIGH (ref 0.2–1.2)
Total Protein: 6.6 g/dL (ref 6.0–8.3)

## 2022-03-09 LAB — TSH: TSH: 3.48 u[IU]/mL (ref 0.35–5.50)

## 2022-03-09 LAB — LIPID PANEL
Cholesterol: 186 mg/dL (ref 0–200)
HDL: 43.4 mg/dL (ref >39.00)
LDL Cholesterol: 109 mg/dL — ABNORMAL HIGH (ref 0–99)
NonHDL: 142.83
Total CHOL/HDL Ratio: 4
Triglycerides: 167 mg/dL — ABNORMAL HIGH (ref 0.0–149.0)
VLDL: 33.4 mg/dL (ref 0.0–40.0)

## 2022-03-09 LAB — PSA: PSA: 0.78 ng/mL (ref 0.10–4.00)

## 2022-03-09 MED ORDER — TADALAFIL 20 MG PO TABS: 10.0000 mg | ORAL_TABLET | ORAL | 11 refills | Status: DC | PRN

## 2022-03-09 NOTE — Assessment & Plan Note (Signed)
Pt's PE WNL.  UTD on colonoscopy, Tdap.  Flu shot given today.  Check labs.  Anticipatory guidance provided.

## 2022-03-09 NOTE — Progress Notes (Signed)
   Subjective:    Patient ID: Juan Oneill, male    DOB: August 11, 1960, 62 y.o.   MRN: [id number]  HPI CPE- UTD on colonoscopy, Tdap  Patient Care Team    Relationship Specialty Notifications Start End  Midge Minium, MD PCP - General Family Medicine  12/22/15   Specialists, Digestive Health  Gastroenterology  02/22/17     Health Maintenance  Topic Date Due   Zoster Vaccines- Shingrix (1 of 2) Never done   INFLUENZA VACCINE  09/27/2021   COVID-19 Vaccine (4 - 2023-24 season) 10/28/2021   DTaP/Tdap/Td (3 - Td or Tdap) 12/21/2025   COLONOSCOPY (Pts 45-57yr Insurance coverage will need to be confirmed)  11/29/2026   Hepatitis C Screening  Completed   HIV Screening  Completed   HPV VACCINES  Aged Out      Review of Systems Patient reports no vision/hearing changes, anorexia, fever ,adenopathy, persistant/recurrent hoarseness, swallowing issues, chest pain, palpitations, edema, persistant/recurrent cough, hemoptysis, dyspnea (rest,exertional, paroxysmal nocturnal), gastrointestinal  bleeding (melena, rectal bleeding), abdominal pain, excessive heart burn, GU symptoms (dysuria, hematuria, voiding/incontinence issues) syncope, focal weakness, memory loss, numbness & tingling, skin/hair/nail changes, depression, anxiety, abnormal bruising/bleeding, musculoskeletal symptoms/signs.     Objective:   Physical Exam General Appearance:    Alert, cooperative, no distress, appears stated age  Head:    Normocephalic, without obvious abnormality, atraumatic  Eyes:    PERRL, conjunctiva/corneas clear, EOM's intact both eyes       Ears:    Normal TM's and external ear canals, both ears  Nose:   Nares normal, septum midline, mucosa normal, no drainage   or sinus tenderness  Throat:   Lips, mucosa, and tongue normal; teeth and gums normal  Neck:   Supple, symmetrical, trachea midline, no adenopathy;       thyroid:  No enlargement/tenderness/nodules  Back:     Symmetric, no curvature, ROM  normal, no CVA tenderness  Lungs:     Clear to auscultation bilaterally, respirations unlabored  Chest wall:    No tenderness or deformity  Heart:    Regular rate and rhythm, S1 and S2 normal, no murmur, rub   or gallop  Abdomen:     Soft, non-tender, bowel sounds active all four quadrants,    no masses, no organomegaly  Genitalia:    deferred  Rectal:    Extremities:   Extremities normal, atraumatic, no cyanosis or edema  Pulses:   2+ and symmetric all extremities  Skin:   Skin color, texture, turgor normal, no rashes or lesions  Lymph nodes:   Cervical, supraclavicular, and axillary nodes normal  Neurologic:   CNII-XII intact. Normal strength, sensation and reflexes      throughout          Assessment & Plan:

## 2022-03-09 NOTE — Patient Instructions (Addendum)
Follow up in 1 year or as needed °We'll notify you of your lab results and make any changes if needed °Keep up the good work!  You look great!! °Call with any questions or concerns °Stay Safe!  Stay Healthy! °Happy New Year!!! °

## 2022-03-09 NOTE — Assessment & Plan Note (Signed)
Currently asymptomatic.  Check labs.

## 2022-03-10 ENCOUNTER — Other Ambulatory Visit: Payer: Self-pay

## 2022-03-10 ENCOUNTER — Telehealth: Payer: Self-pay | Admitting: Family Medicine

## 2022-03-10 ENCOUNTER — Telehealth: Payer: Self-pay

## 2022-03-10 DIAGNOSIS — E559 Vitamin D deficiency, unspecified: Secondary | ICD-10-CM

## 2022-03-10 DIAGNOSIS — R7989 Other specified abnormal findings of blood chemistry: Secondary | ICD-10-CM

## 2022-03-10 MED ORDER — VITAMIN D (ERGOCALCIFEROL) 1.25 MG (50000 UNIT) PO CAPS: 50000.0000 [IU] | ORAL_CAPSULE | ORAL | 12 refills | Status: DC

## 2022-03-10 NOTE — Telephone Encounter (Signed)
Left pt a VM to call office in regards to lab results . Repeat LFT order is in and Vit d has been sent to pharmacy . Pt  needs a 2 week lab only visit

## 2022-03-10 NOTE — Telephone Encounter (Signed)
Pt returned your phone call

## 2022-03-10 NOTE — Telephone Encounter (Signed)
Spoke w/ pt and advised of lab results and he is coming in on 04/20/22 for a lab only visit

## 2022-03-10 NOTE — Telephone Encounter (Signed)
Left pt another Vm to return my call

## 2022-03-10 NOTE — Telephone Encounter (Signed)
error 

## 2022-03-10 NOTE — Telephone Encounter (Signed)
-----  Message from Midge Minium, MD sent at 03/10/2022  7:40 AM EST ----- Alk phos, AST/ALT are all elevated (these are liver enzymes).  Please hold tylenol and alcohol x2 weeks and we will repeat your LFTs at a lab only visit (dx elevated LFTs)  Vit D is low.  Based on this, we need to start 50,000 units weekly x12 weeks in addition to daily OTC supplement of at least 2000 units.   Remainder of labs are stable and look good!

## 2022-03-20 ENCOUNTER — Other Ambulatory Visit (INDEPENDENT_AMBULATORY_CARE_PROVIDER_SITE_OTHER): Payer: BC Managed Care – PPO

## 2022-03-20 DIAGNOSIS — R7989 Other specified abnormal findings of blood chemistry: Secondary | ICD-10-CM

## 2022-03-20 LAB — HEPATIC FUNCTION PANEL
ALT: 32 U/L (ref 0–53)
AST: 22 U/L (ref 0–37)
Albumin: 4.1 g/dL (ref 3.5–5.2)
Alkaline Phosphatase: 117 U/L (ref 39–117)
Bilirubin, Direct: 0.1 mg/dL (ref 0.0–0.3)
Total Bilirubin: 1 mg/dL (ref 0.2–1.2)
Total Protein: 6.9 g/dL (ref 6.0–8.3)

## 2022-03-21 ENCOUNTER — Other Ambulatory Visit: Payer: Self-pay

## 2022-03-21 DIAGNOSIS — N529 Male erectile dysfunction, unspecified: Secondary | ICD-10-CM

## 2022-03-21 MED ORDER — TADALAFIL 20 MG PO TABS: 10.0000 mg | ORAL_TABLET | ORAL | 1 refills | Status: DC | PRN

## 2022-06-25 ENCOUNTER — Other Ambulatory Visit: Payer: Self-pay | Admitting: Family Medicine

## 2022-07-26 ENCOUNTER — Ambulatory Visit: Payer: BC Managed Care – PPO

## 2022-07-26 ENCOUNTER — Encounter: Payer: Self-pay | Admitting: Family Medicine

## 2022-07-26 NOTE — Telephone Encounter (Signed)
I am calling pt to scheduled a nurse for a TDAP

## 2022-12-23 ENCOUNTER — Other Ambulatory Visit: Payer: Self-pay | Admitting: Family Medicine

## 2023-03-12 ENCOUNTER — Telehealth: Payer: Self-pay | Admitting: Family Medicine

## 2023-03-12 ENCOUNTER — Encounter: Payer: BC Managed Care – PPO | Admitting: Family Medicine

## 2023-03-12 NOTE — Telephone Encounter (Signed)
 Called patient and advised due to not having lipids checked in the last year, he will need to be fasting for physical as labs will most likely be ordered including lipids

## 2023-03-12 NOTE — Telephone Encounter (Signed)
 Copied from CRM [id number]. Topic: Clinical - Medical Advice >> Mar 12, 2023  3:50 PM Eleanor C wrote: Reason for CRM: patient is rescheduled for physical for January 30th at 8:20am. Patient was inquiring if he needed to fast before this appointment. Please advise with patient. Thank you

## 2023-03-14 ENCOUNTER — Ambulatory Visit: Payer: Self-pay | Admitting: Family Medicine

## 2023-03-14 ENCOUNTER — Telehealth: Payer: Self-pay | Admitting: Family Medicine

## 2023-03-14 NOTE — Telephone Encounter (Signed)
 Patient is on the scheduled for 03/15/23 with appointment notes stating "[facility] thinks I had a stroke and need to be seen asap.please see what you can do" I called the patient and he said once he described his symptoms the [facility] was quick to tell him the suspicion of stroke and told him to see his PCP. I transferred him to triage to get their recommendation.

## 2023-03-14 NOTE — Telephone Encounter (Signed)
 Chief Complaint: Numbness in face, weakness of left eye, tingling in left hand Symptoms: Numbness in face, weakness of left eye, tingling in left hand, toothache  Frequency: Onset Saturday Pertinent Negatives: Patient denies relief Disposition: [x] ED /[] Urgent Care (no appt availability in office) / [] Appointment(In office/virtual)/ []  [facility]/ [] Home Care/ [] Refused Recommended Disposition /[] [facility]/ []  Follow-up with PCP Additional Notes: Patient called in to report a recent onset of neurological symptoms. Patient stated he went out in the cold on Saturday and when he came back in the house, he had lost sensation in half of his face, noticed drooping/weakness of his left eye, and tingling in his left hand. Patient stated that he felt normal throughout the day on Saturday. Patient stated that his left eye is still weak and his left hand is still tingling. Patient stated that he has regained sensation in his face. Patient also reported that he had a headache in his sleep last night. Advised patient to go to ED to rule out a stroke. Patient stated he is currently at work. Advised patient to leave work and go to ED as soon as possible. Patient complied.   Reason for Disposition  [1] Numbness (i.e., loss of sensation) of the face, arm / hand, or leg / foot on one side of the body AND [2] sudden onset AND [3] brief (now gone)  Answer Assessment - Initial Assessment Questions 1. SYMPTOM: "What is the main symptom you are concerned about?" (e.g., weakness, numbness)     Left eye weakness, two fingers on left hand are tingling, numbness in side of face  2. ONSET: "When did this start?" (minutes, hours, days; while sleeping)     States facial numbness and left eye weakness started on Saturday evening and tingling in fingers has been ongoing for a month on and off   3. LAST NORMAL: "When was the last time you (the patient) were normal (no symptoms)?"     States he felt  normal throughout day on Saturday  4. PATTERN "Does this come and go, or has it been constant since it started?"  "Is it present now?"     States symptoms get worse with cold, facial numbness has gone away, but left eye weakness and tingling in fingers is still present   5. CARDIAC SYMPTOMS: "Have you had any of the following symptoms: chest pain, difficulty breathing, palpitations?"     Denies  6. NEUROLOGIC SYMPTOMS: "Have you had any of the following symptoms: headache, dizziness, vision loss, double vision, changes in speech, unsteady on your feet?"     Headache and difficulty sleeping  7. OTHER SYMPTOMS: "Do you have any other symptoms?"     Toothache  Protocols used: Neurologic Deficit-A-AH

## 2023-03-14 NOTE — Telephone Encounter (Signed)
 FYI about patient care

## 2023-03-15 ENCOUNTER — Ambulatory Visit: Payer: 59 | Admitting: Family Medicine

## 2023-03-15 ENCOUNTER — Encounter: Payer: Self-pay | Admitting: Family Medicine

## 2023-03-15 ENCOUNTER — Ambulatory Visit
Admission: RE | Admit: 2023-03-15 | Discharge: 2023-03-15 | Disposition: A | Discharge status: Home / Self Care | Payer: 59 | Source: Ambulatory Visit | Attending: Family Medicine | Admitting: Family Medicine

## 2023-03-15 VITALS — BP 104/70 | HR 74 | Temp 97.9°F | Wt 198.1 lb

## 2023-03-15 DIAGNOSIS — I6522 Occlusion and stenosis of left carotid artery: Secondary | ICD-10-CM

## 2023-03-15 DIAGNOSIS — H02402 Unspecified ptosis of left eyelid: Secondary | ICD-10-CM

## 2023-03-15 LAB — CBC WITH DIFFERENTIAL/PLATELET
Basophils Absolute: 0 10*3/uL (ref 0.0–0.1)
Basophils Relative: 0.3 % (ref 0.0–3.0)
Eosinophils Absolute: 0.1 10*3/uL (ref 0.0–0.7)
Eosinophils Relative: 2.7 % (ref 0.0–5.0)
HCT: 46.3 % (ref 39.0–52.0)
Hemoglobin: 15.2 g/dL (ref 13.0–17.0)
Lymphocytes Relative: 40.8 % (ref 12.0–46.0)
Lymphs Abs: 2.1 10*3/uL (ref 0.7–4.0)
MCHC: 32.9 g/dL (ref 30.0–36.0)
MCV: 97.6 fL (ref 78.0–100.0)
Monocytes Absolute: 0.5 10*3/uL (ref 0.1–1.0)
Monocytes Relative: 10.2 % (ref 3.0–12.0)
Neutro Abs: 2.4 10*3/uL (ref 1.4–7.7)
Neutrophils Relative %: 46 % (ref 43.0–77.0)
Platelets: 240 10*3/uL (ref 150.0–400.0)
RBC: 4.74 Mil/uL (ref 4.22–5.81)
RDW: 13 % (ref 11.5–15.5)
WBC: 5.2 10*3/uL (ref 4.0–10.5)

## 2023-03-15 LAB — LIPID PANEL
Cholesterol: 189 mg/dL (ref 0–200)
HDL: 50.2 mg/dL (ref >39.00)
LDL Cholesterol: 122 mg/dL — ABNORMAL HIGH (ref 0–99)
NonHDL: 138.85
Total CHOL/HDL Ratio: 4
Triglycerides: 83 mg/dL (ref 0.0–149.0)
VLDL: 16.6 mg/dL (ref 0.0–40.0)

## 2023-03-15 LAB — HEPATIC FUNCTION PANEL
ALT: 40 U/L (ref 0–53)
AST: 27 U/L (ref 0–37)
Albumin: 4.1 g/dL (ref 3.5–5.2)
Alkaline Phosphatase: 99 U/L (ref 39–117)
Bilirubin, Direct: 0.2 mg/dL (ref 0.0–0.3)
Total Bilirubin: 1.7 mg/dL — ABNORMAL HIGH (ref 0.2–1.2)
Total Protein: 6.6 g/dL (ref 6.0–8.3)

## 2023-03-15 LAB — BASIC METABOLIC PANEL
BUN: 16 mg/dL (ref 6–23)
CO2: 30 meq/L (ref 19–32)
Calcium: 9.2 mg/dL (ref 8.4–10.5)
Chloride: 106 meq/L (ref 96–112)
Creatinine, Ser: 0.95 mg/dL (ref 0.40–1.50)
GFR: 85.3 mL/min (ref >60.00)
Glucose, Bld: 97 mg/dL (ref 70–99)
Potassium: 4.8 meq/L (ref 3.5–5.1)
Sodium: 140 meq/L (ref 135–145)

## 2023-03-15 LAB — TSH: TSH: 3 u[IU]/mL (ref 0.35–5.50)

## 2023-03-15 MED ORDER — GADOPICLENOL 0.5 MMOL/ML IV SOLN
10.0000 mL | Freq: Once | INTRAVENOUS | Status: AC | PRN
  Administered 2023-03-15: 10 mL via INTRAVENOUS

## 2023-03-15 NOTE — Patient Instructions (Signed)
Follow up as needed or as scheduled We'll notify you of your lab results and make any changes if needed We'll call you to schedule your MRI START an 81mg  Aspirin once daily IF you develop any facial asymmetry, focal weakness/numbness, slurring of speech, severe headache, or other concerns- GO to the ER for immediate evaluation Call with any questions or concerns Hang in there!!!

## 2023-03-15 NOTE — Progress Notes (Signed)
   Subjective:    Patient ID: Juan Oneill, male    DOB: 07-23-1960, 63 y.o.   MRN: [id number]  HPI Possible CVA- 'all sxs look like I had a stroke'.  Pt reports drooping of L eye starting Saturday while out shoveling snow.  Pt thinks this is due to dry eye- which has been going on for months.  He had no difficulty speaking or swallowing.  No associated weakness or focal deficits.  No dizziness.  No visual changes.  No pain w/ chewing.  Pt has a dental implant, known cavity.   Review of Systems For ROS see HPI     Objective:   Physical Exam Vitals reviewed.  Constitutional:      General: He is not in acute distress.    Appearance: Normal appearance. He is not ill-appearing.  HENT:     Head: Normocephalic and atraumatic.     Right Ear: Tympanic membrane and ear canal normal.     Left Ear: Tympanic membrane and ear canal normal.     Nose: No congestion.  Eyes:     Extraocular Movements: Extraocular movements intact.     Conjunctiva/sclera: Conjunctivae normal.     Pupils: Pupils are equal, round, and reactive to light.     Comments: Ptosis of L eyelid  Cardiovascular:     Rate and Rhythm: Normal rate and regular rhythm.     Pulses: Normal pulses.     Heart sounds: Normal heart sounds.  Pulmonary:     Effort: Pulmonary effort is normal. No respiratory distress.     Breath sounds: No wheezing or rhonchi.  Musculoskeletal:     Cervical back: Neck supple. No rigidity.  Skin:    General: Skin is warm and dry.  Neurological:     Mental Status: He is alert and oriented to person, place, and time.     Cranial Nerves: No cranial nerve deficit.     Sensory: No sensory deficit.     Motor: No weakness.     Coordination: Coordination normal.     Gait: Gait normal.     Deep Tendon Reflexes: Reflexes normal.     Comments: Ptosis of L eye  Psychiatric:        Mood and Affect: Mood normal.        Behavior: Behavior normal.           Assessment & Plan:  Ptosis L eye- new.   Pt reports wife commented on his eye 'drooping' on Saturday after he shoveled the driveway.  He has symmetric smile, can wrinkle forehead- no evidence of Bell's Palsy.  He had no associated red flags- no slurred speech, drooling, trouble swallowing, focal weakness, dizziness, visual changes.  He thinks dry eye is the cause of his sxs but we need to r/o possible TIA/CVA.  His exam is nonfocal and WNL today w/ exception of L eye ptosis.  He reports feeling well- has been working, exercising normally.  Will get MRI to assess.  He was advised that if anything changes, he needs to go to the ER immediately.  Pt expressed understanding and is in agreement w/ plan.

## 2023-03-15 NOTE — Telephone Encounter (Signed)
Pt was seen in office appt 03/15/2023

## 2023-03-16 ENCOUNTER — Other Ambulatory Visit: Payer: 59

## 2023-03-16 MED ORDER — ROSUVASTATIN CALCIUM 10 MG PO TABS: 10.0000 mg | ORAL_TABLET | Freq: Every day | ORAL | 3 refills | Status: DC

## 2023-03-16 NOTE — Addendum Note (Signed)
Addended by: Sheliah Hatch on: 03/16/2023 07:35 AM   Modules accepted: Orders

## 2023-03-16 NOTE — Addendum Note (Signed)
Addended by: Eldred Manges on: 03/16/2023 02:44 PM   Modules accepted: Orders

## 2023-03-19 ENCOUNTER — Encounter: Payer: Self-pay | Admitting: Family Medicine

## 2023-03-21 ENCOUNTER — Other Ambulatory Visit: Payer: Self-pay | Admitting: *Deleted

## 2023-03-21 ENCOUNTER — Ambulatory Visit (HOSPITAL_COMMUNITY): Payer: 59

## 2023-03-21 ENCOUNTER — Encounter: Payer: 59 | Admitting: Vascular Surgery

## 2023-03-21 DIAGNOSIS — H02409 Unspecified ptosis of unspecified eyelid: Secondary | ICD-10-CM

## 2023-03-21 DIAGNOSIS — R42 Dizziness and giddiness: Secondary | ICD-10-CM

## 2023-03-21 NOTE — Progress Notes (Signed)
Patient ID: Juan FITZ, male   DOB: Feb 20, 1961, 63 y.o.   MRN: [id number]  Reason for Consult: No chief complaint on file.   Referred by Sheliah Hatch, MD  Subjective:     HPI  Juan Oneill is a 63 y.o. male presenting for evaluation of carotid artery disease.  He was referred ptosis that was noticed on Saturday 03/09/22.  At that time he had a telemedicine visit and was told he was having a stroke despite no other focal deficits.  He denies any changes in vision or temporary blindness.  He denies any previous strokes or strokelike symptoms.  Specifically denies one-sided weakness, numbness, amaurosis or speech issues. He reports some sensory change to the left forehead and around the left eye that has not improved and reports sometimes he feels some weakness in his eyelid that is worse when he is out in the cold.  Past Medical History:  Diagnosis Date   Arthritis    GSo orto Dx with arthritis   Family History  Problem Relation Age of Onset   Healthy Mother    Cancer Paternal Grandmother        colon   Diabetes Paternal Grandmother    Past Surgical History:  Procedure Laterality Date   HERNIA REPAIR      Short Social History:  Social History   Tobacco Use   Smoking status: Never   Smokeless tobacco: Never  Substance Use Topics   Alcohol use: Yes    Alcohol/week: 2.0 standard drinks of alcohol    Types: 2 Cans of beer per week    No Known Allergies  Current Outpatient Medications  Medication Sig Dispense Refill   levothyroxine (SYNTHROID) 50 MCG tablet TAKE 1 TABLET BY MOUTH EVERY DAY 90 tablet 1   rosuvastatin (CRESTOR) 10 MG tablet Take 1 tablet (10 mg total) by mouth daily. 30 tablet 3   tadalafil (CIALIS) 20 MG tablet Take 0.5-1 tablets (10-20 mg total) by mouth every other day as needed for erectile dysfunction. 10 tablet 1   No current facility-administered medications for this visit.    REVIEW OF SYSTEMS   All other systems were  reviewed and are negative     Objective:  Objective   There were no vitals filed for this visit. There is no height or weight on file to calculate BMI.  Physical Exam General: no acute distress Cardiac: hemodynamically stable Pulm: normal work of breathing Neuro: alert, no focal deficit Extremities: no edema, cyanosis or wounds Vascular:   Right: Palpable radial  Left: Palpable radial   Data: Carotid duplex Right Carotid Findings:  +----------+--------+--------+--------+------------------+--------+           PSV cm/sEDV cm/sStenosisPlaque DescriptionComments  +----------+--------+--------+--------+------------------+--------+  CCA Prox  90      25                                          +----------+--------+--------+--------+------------------+--------+  CCA Mid   72      18                                          +----------+--------+--------+--------+------------------+--------+  CCA Distal84      22                                          +----------+--------+--------+--------+------------------+--------+  ICA Prox  65      22      1-39%   homogeneous                 +----------+--------+--------+--------+------------------+--------+  ICA Mid   107     28                                          +----------+--------+--------+--------+------------------+--------+  ICA Distal86      50                                          +----------+--------+--------+--------+------------------+--------+  ECA      136     27                                          +----------+--------+--------+--------+------------------+--------+    +---------+--------+--+--------+-+-----------------------------------------  --+  VertebralPSV cm/s35EDV cm/s7Antegrade, High resistant and Small  caliber  +---------+--------+--+--------+-+-----------------------------------------  --+     Left Carotid Findings:   +----------+--------+--------+--------+------------------+--------+           PSV cm/sEDV cm/sStenosisPlaque DescriptionComments  +----------+--------+--------+--------+------------------+--------+  CCA Prox  86      20                                          +----------+--------+--------+--------+------------------+--------+  CCA Mid   89      22                                          +----------+--------+--------+--------+------------------+--------+  CCA Distal86      26                                          +----------+--------+--------+--------+------------------+--------+  ICA Prox  77      20      1-39%   homogeneous                 +----------+--------+--------+--------+------------------+--------+  ICA Mid   55      29                                          +----------+--------+--------+--------+------------------+--------+  ECA      110     21                                          +----------+--------+--------+--------+------------------+--------+    +---------+--------+--+--------+--+---------+  VertebralPSV cm/s39EDV cm/s14Antegrade  +---------+--------+--+--------+--+---------+    MRI reviewed No CVA 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 male who presented for evaluation of carotid  artery disease.  I explained that his carotid arteries are completely normal and free from plaque, and we discussed that his symptoms seem to be all in the region of the ophthalmic branch of the left trigeminal nerve with his nonspecific sensory change of the left forehead and around the left eye.  I explained that I do not believe that he had a TIA and stroke was ruled out on the MRI. He is compliant with aspirin and Crestor.  We discussed a referral to neurology for official recommendations for his distal left ICA stenosis  and also for evaluation of his ongoing symptoms.       Daria Pastures MD [facility]

## 2023-03-22 ENCOUNTER — Encounter: Payer: BC Managed Care – PPO | Admitting: Family Medicine

## 2023-03-23 ENCOUNTER — Ambulatory Visit: Payer: 59 | Admitting: Vascular Surgery

## 2023-03-23 ENCOUNTER — Encounter: Payer: Self-pay | Admitting: Vascular Surgery

## 2023-03-23 ENCOUNTER — Ambulatory Visit (HOSPITAL_COMMUNITY)
Admission: RE | Admit: 2023-03-23 | Discharge: 2023-03-23 | Disposition: A | Discharge status: Home / Self Care | Payer: 59 | Source: Ambulatory Visit | Attending: Vascular Surgery | Admitting: Vascular Surgery

## 2023-03-23 VITALS — BP 108/72 | HR 66 | Temp 98.3°F | Ht 72.0 in | Wt 196.0 lb

## 2023-03-23 DIAGNOSIS — R42 Dizziness and giddiness: Secondary | ICD-10-CM

## 2023-03-23 DIAGNOSIS — I6522 Occlusion and stenosis of left carotid artery: Secondary | ICD-10-CM | POA: Diagnosis not present

## 2023-03-23 DIAGNOSIS — H02409 Unspecified ptosis of unspecified eyelid: Secondary | ICD-10-CM | POA: Diagnosis present

## 2023-03-27 ENCOUNTER — Encounter: Payer: Self-pay | Admitting: Neurology

## 2023-03-27 ENCOUNTER — Ambulatory Visit: Payer: 59 | Admitting: Neurology

## 2023-03-27 VITALS — BP 122/76 | HR 78 | Ht 72.0 in | Wt 197.0 lb

## 2023-03-27 DIAGNOSIS — I6522 Occlusion and stenosis of left carotid artery: Secondary | ICD-10-CM | POA: Diagnosis not present

## 2023-03-27 DIAGNOSIS — R519 Headache, unspecified: Secondary | ICD-10-CM | POA: Insufficient documentation

## 2023-03-27 MED ORDER — GABAPENTIN 300 MG PO CAPS: 600.0000 mg | ORAL_CAPSULE | Freq: Every day | ORAL | 11 refills | Status: DC

## 2023-03-27 NOTE — Progress Notes (Signed)
Chief Complaint  Patient presents with   New Patient (Initial Visit)    Pt in 14, here alone Pt is referred for distal left ICA stenosis. Pt states 3 weeks ago he started having face pain, left eye was drooping. Pt states he continues with left eye pain, states feels his eye feels weak and the pain keeps him up at night.       ASSESSMENT AND PLAN  Juan Oneill is a 63 y.o. 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   MEDICAL HISTORY:  Juan Oneill is a 63 year old male, seen in request by vascular surgeon Dr. Hetty Blend, Joselyn Glassman for evaluation of left carotid artery stenosis, his primary care is Dr. Beverely Low, Helane Rima, initial evaluation was on March 27, 2023  History is obtained from the patient and review of electronic medical records. I personally reviewed pertinent available imaging films in PACS.   PMHx of  Hypothyroidism HLD, on crestor since Jan 2025  In mid January 2025 during the code snowy days, he went out to plow the snow off the driveway, it was very cold outside, shortly afterwards he felt coldness achy pain in one of his left lower tooth implant, then spreading to left side headaches, when he stepped inside the room, left tooth ache quickly improved, but he continue to have left side achy discomfort, wife also noticed mild droopiness of his left eye, he was seen by dentist, there was no acute abnormality noted  But since his onset, he has this intermittent deep achy pain involving left temporal retro-orbital region, worsening at nighttime, sometimes difficulty sleeping, as  needed NSAIDs seems to help him some  For that reason he had MRI of the brain with without contrast on March 15, 2023, no acute intracranial abnormality, severe stenosis in the left distal cervical internal carotid artery  He was seen by vascular surgeon Dr. Hetty Blend on March 23, 2023, no intervention is needed  He has been Crestor and aspirin 81 mg since symptom onset   PHYSICAL EXAM:   Vitals:   03/27/23 1020  BP: 122/76  Pulse: 78  Weight: 197 lb (89.4 kg)  Height: 6' (1.829 m)    Body mass index is 26.72 kg/m.  PHYSICAL EXAMNIATION:  Gen: NAD, conversant, well nourised, well groomed                     Cardiovascular: Regular rate rhythm, no peripheral edema, warm, nontender. Eyes: Conjunctivae clear without exudates or hemorrhage Neck: Supple, no carotid bruits. Pulmonary: Clear to auscultation bilaterally   NEUROLOGICAL EXAM:  MENTAL STATUS: Speech/cognition: Awake, alert, oriented to history taking and casual conversation CRANIAL NERVES: CN II: Visual fields are full to confrontation. Pupils are round equal and briskly reactive to light.  Funduscopic examination was normal,  CN III, IV, VI: extraocular movement are normal.  There was redundant soft tissue at bilateral upper eyelid, most noticeable at the left side, at the upper edge of left pupil, CN V: Facial sensation is intact to light touch CN VII: Face is symmetric with normal eye closure  CN VIII: Hearing is normal to causal conversation. CN IX,  X: Phonation is normal. CN XI: Head turning and shoulder shrug are intact  MOTOR: There is no pronator drift of out-stretched arms. Muscle bulk and tone are normal. Muscle strength is normal.  REFLEXES: Reflexes are 2+ and symmetric at the biceps, triceps, knees, and ankles. Plantar responses are flexor.  SENSORY: Intact to light touch, pinprick and vibratory sensation are intact in fingers and toes.  COORDINATION: There is no trunk or limb dysmetria  noted.  GAIT/STANCE: Posture is normal. Gait is steady with normal steps, base, arm swing, and turning. Heel and toe walking are normal. Tandem gait is normal.  Romberg is absent.  REVIEW OF SYSTEMS:  Full 14 system review of systems performed and notable only for as above All other review of systems were negative.   ALLERGIES: No Known Allergies  HOME MEDICATIONS: Current Outpatient Medications  Medication Sig Dispense Refill   levothyroxine (SYNTHROID) 50 MCG tablet TAKE 1 TABLET BY MOUTH EVERY DAY 90 tablet 1   rosuvastatin (CRESTOR) 10 MG tablet Take 1 tablet (10 mg total) by mouth daily. 30 tablet 3   tadalafil (CIALIS) 20 MG tablet Take 0.5-1 tablets (10-20 mg total) by mouth every other day as needed for erectile dysfunction. 10 tablet 1   No current facility-administered medications for this visit.    PAST MEDICAL HISTORY: Past Medical History:  Diagnosis Date   Arthritis    GSo orto Dx with arthritis   Hyperlipidemia    Thyroid disease     PAST SURGICAL HISTORY: Past Surgical History:  Procedure Laterality Date   HERNIA REPAIR      FAMILY HISTORY: Family History  Problem Relation Age of Onset   Healthy Mother    Dementia Maternal Grandmother    Cancer Paternal Grandmother        colon   Diabetes Paternal Grandmother    Migraines Daughter     SOCIAL HISTORY: Social History   Socioeconomic History   Marital status: Married    Spouse name: Not on file   Number of children: Not on file   Years of education: Not on file   Highest education level: Bachelor's degree (e.g., BA, AB, BS)  Occupational History   Not on file  Tobacco Use   Smoking status: Never   Smokeless tobacco: Never  Vaping Use   Vaping status: Never Used  Substance and Sexual Activity   Alcohol use: Not Currently    Comment: has stopped drinking   Drug use: No   Sexual activity: Not on file  Other Topics Concern   Not on file  Social History Narrative   Not on file    Social Drivers of Health   Financial Resource Strain: Low Risk  (03/14/2023)   Overall Financial Resource Strain (CARDIA)    Difficulty of Paying Living Expenses: Not hard at all  Food Insecurity: No Food Insecurity (03/14/2023)   Hunger Vital Sign    Worried About Running Out of Food in the Last Year: Never true    Ran Out of Food in the Last Year: Never true  Transportation Needs: No Transportation Needs (03/14/2023)   PRAPARE - Administrator, Civil Service (Medical): No    Lack of Transportation (Non-Medical): No  Physical Activity: Sufficiently Active (03/14/2023)   Exercise Vital Sign    Days of Exercise per Week: 4 days    Minutes of Exercise per Session: 40 min  Stress: No Stress Concern Present (03/14/2023)   [employer] - Occupational Stress  Questionnaire    Feeling of Stress : Only a little  Social Connections: Unknown (03/14/2023)   Social Connection and Isolation Panel [NHANES]    Frequency of Communication with Friends and Family: Once a week    Frequency of Social Gatherings with Friends and Family: Patient declined    Attends Religious Services: More than 4 times per year    Active Member of [employer] or Organizations: No    Attends Engineer, structural: Not on file    Marital Status: Married  Intimate Partner Violence: Unknown (09/13/2021)   Received from [employer], [facility]   HITS    Physically Hurt: Not on file    Insult or Talk Down To: Not on file    Threaten Physical Harm: Not on file    Scream or Curse: Not on file      Levert Feinstein, M.D. Ph.D.  [facility] [address], [address] [place], [place] [address] Ph: [phone] Fax: [phone]  CC:  Daria Pastures, MD [address] [place],  [place] [address]  Sheliah Hatch, MD

## 2023-03-28 ENCOUNTER — Telehealth: Payer: Self-pay | Admitting: Neurology

## 2023-03-28 NOTE — Telephone Encounter (Signed)
[employer] state no auth required sent to [facility] [phone]

## 2023-03-29 ENCOUNTER — Encounter: Payer: Self-pay | Admitting: Family Medicine

## 2023-03-29 ENCOUNTER — Ambulatory Visit (INDEPENDENT_AMBULATORY_CARE_PROVIDER_SITE_OTHER): Payer: 59 | Admitting: Family Medicine

## 2023-03-29 VITALS — BP 128/68 | HR 68 | Temp 98.0°F | Ht 72.0 in | Wt 191.2 lb

## 2023-03-29 DIAGNOSIS — Z0000 Encounter for general adult medical examination without abnormal findings: Secondary | ICD-10-CM | POA: Diagnosis not present

## 2023-03-29 NOTE — Progress Notes (Signed)
   Subjective:    Patient ID: Juan Oneill, male    DOB: September 06, 1960, 63 y.o.   MRN: [id number]  HPI CPE- UTD on colonoscopy, Tdap.  Patient Care Team    Relationship Specialty Notifications Start End  Sheliah Hatch, MD PCP - General Family Medicine  12/22/15   Specialists, [facility]  Gastroenterology  02/22/17     Health Maintenance  Topic Date Due   INFLUENZA VACCINE  09/28/2022   Zoster Vaccines- Shingrix (2 of 2) 11/25/2022   Colonoscopy  02/06/2028   DTaP/Tdap/Td (5 - Td or Tdap) 09/29/2032   Hepatitis C Screening  Completed   HIV Screening  Completed   HPV VACCINES  Aged Out   COVID-19 Vaccine  Discontinued      Review of Systems Patient reports no hearing changes, anorexia, fever ,adenopathy, persistant/recurrent hoarseness, swallowing issues, chest pain, palpitations, edema, persistant/recurrent cough, hemoptysis, dyspnea (rest,exertional, paroxysmal nocturnal), gastrointestinal  bleeding (melena, rectal bleeding), abdominal pain, excessive heart burn, GU symptoms (dysuria, hematuria, voiding/incontinence issues) syncope, focal weakness, memory loss, numbness & tingling, skin/hair/nail changes, depression, anxiety, abnormal bruising/bleeding, musculoskeletal symptoms/signs.   + visual changes- prompted stroke workup    Objective:   Physical Exam General Appearance:    Alert, cooperative, no distress, appears stated age  Head:    Normocephalic, without obvious abnormality, atraumatic  Eyes:    PERRL, conjunctiva/corneas clear, EOM's intact both eyes.  L eye ptosis     Ears:    Normal TM's and external ear canals, both ears  Nose:   Nares normal, septum midline, mucosa normal, no drainage   or sinus tenderness  Throat:   Lips, mucosa, and tongue normal; teeth and gums normal  Neck:   Supple, symmetrical, trachea midline, no adenopathy;       thyroid:  No enlargement/tenderness/nodules  Back:     Symmetric, no curvature, ROM normal, no CVA tenderness   Lungs:     Clear to auscultation bilaterally, respirations unlabored  Chest wall:    No tenderness or deformity  Heart:    Regular rate and rhythm, S1 and S2 normal, no murmur, rub   or gallop  Abdomen:     Soft, non-tender, bowel sounds active all four quadrants,    no masses, no organomegaly  Genitalia:    Normal male without lesion, masses,discharge or tenderness  Rectal:    Deferred due to young age  Extremities:   Extremities normal, atraumatic, no cyanosis or edema  Pulses:   2+ and symmetric all extremities  Skin:   Skin color, texture, turgor normal, no rashes or lesions  Lymph nodes:   Cervical, supraclavicular, and axillary nodes normal  Neurologic:   CNII-XII intact. Normal strength, sensation and reflexes      throughout          Assessment & Plan:

## 2023-03-29 NOTE — Patient Instructions (Addendum)
Follow up in 6 months to recheck cholesterol Your recent labs look great! Keep up the good work on healthy diet and regular exercise- you're doing great! Call with any questions or concerns Stay Safe!  Stay Healthy! Hang in there!!

## 2023-03-29 NOTE — Assessment & Plan Note (Signed)
Pt's PE WNL w/ exception of known L eye ptosis.  UTD on colonoscopy, Tdap.  Declines flu.  Reviewed recent labs.  No need to repeat.  Anticipatory guidance provided.

## 2023-03-30 ENCOUNTER — Encounter: Payer: Self-pay | Admitting: Neurology

## 2023-03-30 DIAGNOSIS — I6522 Occlusion and stenosis of left carotid artery: Secondary | ICD-10-CM

## 2023-03-30 DIAGNOSIS — R519 Headache, unspecified: Secondary | ICD-10-CM

## 2023-03-30 LAB — VITAMIN B12: Vitamin B-12: 565 pg/mL (ref 232–1245)

## 2023-03-30 LAB — METHYLMALONIC ACID, SERUM: Methylmalonic Acid: 175 nmol/L (ref 0–378)

## 2023-03-30 LAB — FOLATE: Folate: 9.9 ng/mL (ref >3.0)

## 2023-03-30 LAB — HGB A1C W/O EAG: Hgb A1c MFr Bld: 5.5 % (ref 4.8–5.6)

## 2023-03-30 LAB — SEDIMENTATION RATE: Sed Rate: 2 mm/h (ref 0–30)

## 2023-03-30 LAB — RPR: RPR Ser Ql: NONREACTIVE

## 2023-03-30 LAB — HOMOCYSTEINE: Homocysteine: 11.7 umol/L (ref 0.0–17.2)

## 2023-03-30 LAB — C-REACTIVE PROTEIN: CRP: 4 mg/L (ref 0–10)

## 2023-03-30 NOTE — Telephone Encounter (Signed)
Patient is requesting to have a Influenza vaccine        and Shingles vaccine      Needs provider approval/order prior to scheduling? Yes  Message sent to clinic pool for authorization/order? Yes

## 2023-04-03 ENCOUNTER — Encounter: Payer: Self-pay | Admitting: Neurology

## 2023-04-09 ENCOUNTER — Ambulatory Visit
Admission: RE | Admit: 2023-04-09 | Discharge: 2023-04-09 | Disposition: A | Discharge status: Home / Self Care | Payer: 59 | Source: Ambulatory Visit | Attending: Neurology | Admitting: Neurology

## 2023-04-09 DIAGNOSIS — R519 Headache, unspecified: Secondary | ICD-10-CM

## 2023-04-09 DIAGNOSIS — I6522 Occlusion and stenosis of left carotid artery: Secondary | ICD-10-CM

## 2023-04-09 MED ORDER — IOPAMIDOL (ISOVUE-370) INJECTION 76%
75.0000 mL | Freq: Once | INTRAVENOUS | Status: AC | PRN
  Administered 2023-04-09: 75 mL via INTRAVENOUS

## 2023-04-12 ENCOUNTER — Other Ambulatory Visit: Payer: Self-pay | Admitting: Family Medicine

## 2023-04-12 DIAGNOSIS — N529 Male erectile dysfunction, unspecified: Secondary | ICD-10-CM

## 2023-04-13 ENCOUNTER — Encounter (HOSPITAL_COMMUNITY): Payer: 59

## 2023-04-13 ENCOUNTER — Encounter: Payer: 59 | Admitting: Vascular Surgery

## 2023-04-15 ENCOUNTER — Encounter: Payer: Self-pay | Admitting: Family Medicine

## 2023-04-21 ENCOUNTER — Telehealth: Payer: Self-pay | Admitting: Neurology

## 2023-04-21 DIAGNOSIS — I7771 Dissection of carotid artery: Secondary | ICD-10-CM | POA: Insufficient documentation

## 2023-04-21 MED ORDER — CLOPIDOGREL BISULFATE 75 MG PO TABS: 75.0000 mg | ORAL_TABLET | Freq: Every day | ORAL | 0 refills | Status: DC

## 2023-04-21 NOTE — Telephone Encounter (Signed)
 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 Dissection extends into the left petrous ICA. No pseudoaneurysm.   Electronically Signed: By: Feliberto Harts M.D. On: 04/21/2023 11:28

## 2023-04-23 NOTE — Telephone Encounter (Signed)
 noted

## 2023-04-27 ENCOUNTER — Telehealth: Payer: Self-pay | Admitting: Neurology

## 2023-04-27 ENCOUNTER — Encounter: Payer: Self-pay | Admitting: Neurology

## 2023-04-27 NOTE — Telephone Encounter (Signed)
 error

## 2023-04-30 MED ORDER — BUTALBITAL-APAP-CAFFEINE 50-325-40 MG PO TABS: 1.0000 | ORAL_TABLET | Freq: Three times a day (TID) | ORAL | 3 refills | Status: DC | PRN

## 2023-05-08 ENCOUNTER — Ambulatory Visit
Admission: RE | Admit: 2023-05-08 | Discharge: 2023-05-08 | Disposition: A | Discharge status: Home / Self Care | Payer: 59 | Source: Ambulatory Visit | Attending: Neurology | Admitting: Neurology

## 2023-05-08 DIAGNOSIS — I7771 Dissection of carotid artery: Secondary | ICD-10-CM

## 2023-05-08 MED ORDER — GADOPICLENOL 0.5 MMOL/ML IV SOLN
8.0000 mL | Freq: Once | INTRAVENOUS | Status: AC | PRN
  Administered 2023-05-08: 8 mL via INTRAVENOUS

## 2023-05-09 ENCOUNTER — Encounter: Payer: Self-pay | Admitting: Neurology

## 2023-05-10 ENCOUNTER — Telehealth: Payer: Self-pay | Admitting: Neurology

## 2023-05-10 NOTE — Telephone Encounter (Signed)
-----   Message from Stryker Dohmeier sent at 05/09/2023  6:12 PM EDT ----- patient being evaluated for follow-up for left carotid dissection- There is focal area of irregularity at the skull base of the left internal carotid artery with less than 50% narrowing.  Adequate flow.  likely representing partially healed previously described focal dissection which is not flow-limiting- good result, no further imaging needed at this time.

## 2023-05-14 ENCOUNTER — Telehealth: Admitting: Neurology

## 2023-05-14 DIAGNOSIS — R519 Headache, unspecified: Secondary | ICD-10-CM

## 2023-05-14 DIAGNOSIS — I6522 Occlusion and stenosis of left carotid artery: Secondary | ICD-10-CM

## 2023-05-14 DIAGNOSIS — I7771 Dissection of carotid artery: Secondary | ICD-10-CM

## 2023-05-14 NOTE — Progress Notes (Signed)
 ASSESSMENT AND PLAN  Juan Oneill is a 63 y.o. male   Distal cervical left internal carotid artery stenosis  That was confirmed by CT angiogram of head and neck, MRA of head and neck showed no flow-limiting lesion, less than 50% stenosis of left internal carotid artery at the skull base extending to petrous region,  Overlapping aspirin plus Plavix 75 mg for 30 days then aspirin alone  Avoiding strenuous exercise for 6 months  New onset intermittent headaches,   ESR C-reactive protein were within normal limit  Gabapentin up to 300 mg 2 tablets every night for headache  Return to clinic with nurse practitioner, may consider repeat MRA of brain  DIAGNOSTIC DATA (LABS, IMAGING, TESTING) - I reviewed patient records, labs, notes, testing and imaging myself where available.   MEDICAL HISTORY:  Juan Oneill is a 63 year old male, seen in request by vascular surgeon Dr. Hetty Blend, Joselyn Glassman for evaluation of left carotid artery stenosis, his primary care is Dr. Beverely Low, Helane Rima, initial evaluation was on March 27, 2023  History is obtained from the patient and review of electronic medical records. I personally reviewed pertinent available imaging films in PACS.   PMHx of  Hypothyroidism HLD, on crestor since Jan 2025  In mid January 2025 during the cold snowy days, he drove the truck to plow the snow off the driveway, it was very cold outside, shortly afterwards he felt coldness achy pain in one of his left lower tooth implant, then spreading to left side headaches, when he stepped inside the room, left tooth ache quickly improved, but he continue to have left side achy discomfort, wife also noticed mild droopiness of his left eye, he was seen by dentist, there was no acute abnormality noted  But since his onset, he has this intermittent deep achy pain involving left temporal retro-orbital region, worsening at nighttime, sometimes difficulty sleeping, as needed NSAIDs seems to  help him some  For that reason he had MRI of the brain with without contrast on March 15, 2023, no acute intracranial abnormality, severe stenosis in the left distal cervical internal carotid artery  He was seen by vascular surgeon Dr. Hetty Blend on March 23, 2023, no intervention is needed  He has been Crestor and aspirin 81 mg since symptom onset   Virtual Visit via video UPDATE May 14, 2023 I discussed the limitations of evaluation and management by telemedicine and the availability of in person appointments. The patient expressed understanding and agreed to proceed  Location: Provider: [facility] office; Patient: Home  I connected with Juan Oneill  on May 14, 2023 by a video enabled telemedicine application and verified that I am speaking with the correct person using two identifiers.  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  MRA of head and neck May 08, 2023 showed no significant narrowing of carotid arteries in the neck, both vertebral artery anterograde flow, with left side dominant, right hypoplastic MRA of head focal narrowing of left internal carotid artery at the skull base, likely partially healed focal dissection with no flow-limiting  He was put on aspirin plus Plavix 75 mg, overlapping for 30 days, then aspirin alone, also advised  him to the emergency room if he develop any focal signs  Reviewed laboratory evaluation, normal A1c RPR folic acid, homocystine, methylmalonic acid level B12 ESR C-reactive protein TSH, CBC, BMP, LDL was 122   Observations/Objective: I have reviewed problem lists, medications, allergies. Awake,  alert, oriented to history taking and casual conversation, facial symmetric, no dysarthria, no aphasia, moving upper extremity without difficulty     REVIEW OF SYSTEMS:  Full 14 system review of systems performed and notable only for as above All other review of systems were negative.   ALLERGIES: No Known Allergies  HOME MEDICATIONS: Current Outpatient Medications  Medication Sig Dispense Refill   butalbital-acetaminophen-caffeine (FIORICET) 50-325-40 MG tablet Take 1 tablet by mouth every 8 (eight) hours as needed for headache. 30 tablet 3   clopidogrel (PLAVIX) 75 MG tablet Take 1 tablet (75 mg total) by mouth daily. 30 tablet 0   gabapentin (NEURONTIN) 300 MG capsule Take 2 capsules (600 mg total) by mouth at bedtime. 60 capsule 11   levothyroxine (SYNTHROID) 50 MCG tablet TAKE 1 TABLET BY MOUTH EVERY DAY 90 tablet 1   rosuvastatin (CRESTOR) 10 MG tablet Take 1 tablet (10 mg total) by mouth daily. 30 tablet 3   tadalafil (CIALIS) 20 MG tablet TAKE 1/2 TO 1 TABLET (10 MG TO 20 MG TOTAL) BY MOUTH EVERY OTHER DAY AS NEEDED FOR ERECTILE DYSFUNCTION 10 tablet 1   No current facility-administered medications for this visit.    PAST MEDICAL HISTORY: Past Medical History:  Diagnosis Date   Arthritis    GSo orto Dx with arthritis   Hyperlipidemia    Thyroid disease     PAST SURGICAL HISTORY: Past Surgical History:  Procedure Laterality Date   HERNIA REPAIR      FAMILY HISTORY: Family History  Problem Relation Age of Onset   Healthy Mother    Dementia Maternal Grandmother    Cancer Paternal Grandmother        colon   Diabetes Paternal Grandmother    Migraines Daughter     SOCIAL HISTORY: Social History   Socioeconomic History   Marital status: Married    Spouse name: Not on file   Number of children: Not on file   Years of education: Not on file   Highest education level: Bachelor's degree (e.g., BA, AB, BS)  Occupational History   Not on file  Tobacco Use    Smoking status: Never   Smokeless tobacco: Never  Vaping Use   Vaping status: Never Used  Substance and Sexual Activity   Alcohol use: Not Currently    Comment: has stopped drinking   Drug use: No   Sexual activity: Not on file  Other Topics Concern   Not on file  Social History Narrative   Not on file   Social Drivers of Health   Financial Resource Strain: Low Risk  (03/14/2023)   Overall Financial Resource Strain (CARDIA)    Difficulty of Paying Living Expenses: Not hard at all  Food Insecurity: No Food Insecurity (03/14/2023)   Hunger Vital Sign    Worried About Running Out of Food in the Last Year: Never true    Ran Out of Food in the Last Year: Never true  Transportation Needs: No Transportation Needs (03/14/2023)   PRAPARE - Administrator, Civil Service (Medical): No    Lack of Transportation (Non-Medical): No  Physical Activity: Sufficiently Active (03/14/2023)   Exercise Vital Sign    Days of Exercise per Week: 4 days  Minutes of Exercise per Session: 40 min  Stress: No Stress Concern Present (03/14/2023)   [employer]ational [employer] - Occupational Stress Questionnaire    Feeling of Stress : Only a little  Social Connections: Unknown (03/14/2023)   Social Connection and Isolation Panel [NHANES]    Frequency of Communication with Friends and Family: Once a week    Frequency of Social Gatherings with Friends and Family: Patient declined    Attends Religious Services: More than 4 times per year    Active Member of [employer] or Organizations: No    Attends Engineer, structural: Not on file    Marital Status: Married  Intimate Partner Violence: Unknown (09/13/2021)   Received from [employer], [facility]   HITS    Physically Hurt: Not on file    Insult or Talk Down To: Not on file    Threaten Physical Harm: Not on file    Scream or Curse: Not on file      Levert Feinstein, M.D. Ph.D.  [facility] [address],  [address] [place], [place] [address] Ph: [phone] Fax: [phone]  CC:  Sheliah Hatch, MD [address] [place],  [place] [address]  Sheliah Hatch, MD    Total time spent reviewing the chart, obtaining history, examined patient, ordering tests, documentation, consultations and family, care coordination was  40 minutes

## 2023-05-18 ENCOUNTER — Telehealth: Payer: Self-pay | Admitting: Family Medicine

## 2023-05-18 NOTE — Telephone Encounter (Signed)
 Patient requesting referral to vascular for a second opinion is this an appropriate referral please advise

## 2023-05-18 NOTE — Telephone Encounter (Signed)
 Copied from CRM [id number]. Topic: Referral - Request for Referral >> May 18, 2023 12:00 PM Gurney Maxin H wrote:  Did the patient discuss referral with their provider in the last year? Yes (If No - schedule appointment) (If Yes - send message)  Appointment offered? No  Type of order/referral and detailed reason for visit: Vascular Surgery   Preference of office, provider, location: [facility] [phone]  If referral order, have you been seen by this specialty before? Yes, within [facility] Dr. Hetty Blend going for a second opinion (If Yes, this issue or another issue? When? Where?  Can we respond through MyChart? Yes

## 2023-05-24 ENCOUNTER — Other Ambulatory Visit: Payer: Self-pay

## 2023-05-24 DIAGNOSIS — I6522 Occlusion and stenosis of left carotid artery: Secondary | ICD-10-CM

## 2023-05-24 NOTE — Telephone Encounter (Signed)
 Referral has been placed.

## 2023-05-24 NOTE — Telephone Encounter (Signed)
 Ok for referral to Vascular (Dr [facility] Kearns at [facility]) for a 2nd opinion.  Dx- Dissection of L carotid artery

## 2023-05-24 NOTE — Telephone Encounter (Signed)
 Unable to leave vm.

## 2023-05-28 NOTE — Telephone Encounter (Signed)
 Ok for referral to Vascular (Dr Wray Kearns at [facility]) for a 2nd opinion. Dx- Dissection of L carotid artery  Please place advise

## 2023-05-28 NOTE — Telephone Encounter (Signed)
 Referral was placed back to [facility] [place]. Did you mean to send to Vascular (Dr Wray Kearns at [facility]) for a 2nd opinion. Dx- Dissection of L carotid artery? Per previous note.

## 2023-06-13 ENCOUNTER — Other Ambulatory Visit: Payer: Self-pay | Admitting: Family Medicine

## 2023-06-21 ENCOUNTER — Other Ambulatory Visit: Payer: Self-pay | Admitting: Family Medicine

## 2023-06-27 ENCOUNTER — Telehealth: Admitting: Neurology

## 2023-06-27 DIAGNOSIS — R519 Headache, unspecified: Secondary | ICD-10-CM | POA: Diagnosis not present

## 2023-06-27 DIAGNOSIS — I7771 Dissection of carotid artery: Secondary | ICD-10-CM

## 2023-06-27 DIAGNOSIS — I6522 Occlusion and stenosis of left carotid artery: Secondary | ICD-10-CM | POA: Diagnosis not present

## 2023-06-27 NOTE — Progress Notes (Signed)
 Virtual Visit via Video Note  I connected with Juan Oneill on 06/27/23 at 10:15 AM EDT by a video enabled telemedicine application and verified that I am speaking with the correct person using two identifiers.  Location: Patient: in his car  Provider: in the office    ASSESSMENT AND PLAN  Juan Oneill is a 63 y.o. 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 Gracie Lav, repeat CTA head and neck Sept 2025, follow up with Dr. Gracie Lav afterwards to review. I sent myself a reminder to contact patient mid-August to schedule, scheduled VV with Dr. Gracie Lav 9/23  New onset intermittent headaches  Improved  ESR C-reactive protein were within normal limit  Has gabapentin  up to 300 mg 2 tablets every night for headache  DIAGNOSTIC DATA (LABS, IMAGING, TESTING) - I reviewed patient records, labs, notes, testing and imaging myself where available.  05/08/2023 MRA Head: IMPRESSION: MR angiogram study of the brain without contrast showing area of focal narrowing of the left internal carotid artery at the skull base likely representing partially healed previously described focal dissection which is not flow-limiting. 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   05/08/23 MRA Neck IMPRESSION: MR angiogram study of the neck with and without contrast showing no significant narrowing of either carotid artery in the neck.  Both vertebral arteries have antegrade flow with left is dominant and right is hypoplastic.   MEDICAL HISTORY:  Juan Oneill is a 63 year old male, seen in request by vascular surgeon Dr.  Susi Eric, Herminia Lope for evaluation of left carotid artery stenosis, his primary care is Dr. Paulla Bossier, Elenore Griffon, initial evaluation was on March 27, 2023  History is obtained from the patient and review of electronic medical records. I personally reviewed pertinent available imaging films in PACS.   PMHx of  Hypothyroidism HLD, on crestor  since Jan 2025  In mid January 2025 during the cold snowy days, he drove the truck to plow the snow off the driveway, it was very cold outside, shortly afterwards he felt coldness achy pain in one of his left lower tooth implant, then spreading to left side headaches, when he stepped inside the room, left tooth ache quickly improved, but he continue to have left side achy discomfort, wife also noticed mild droopiness of his left eye, he was seen by dentist, there was no acute abnormality noted  But since his onset, he has this intermittent deep achy pain involving left temporal retro-orbital region, worsening at nighttime, sometimes difficulty sleeping, as needed NSAIDs seems to help him some  For that reason he had MRI of the brain with without contrast on March 15, 2023, no acute intracranial abnormality, severe stenosis in the left distal cervical internal carotid artery  He was seen by vascular surgeon Dr. Susi Eric on March 23, 2023, no intervention is needed  He has been Crestor  and aspirin 81 mg since symptom onset  Virtual Visit via video UPDATE May 14, 2023 I discussed the limitations of evaluation and management by telemedicine and the availability of in person appointments. The patient expressed understanding and agreed to proceed  Location:  Provider: [facility] office; Patient: Home  I connected with Juan Oneill  on May 14, 2023 by a video enabled telemedicine application and verified that I am speaking with the correct person using two identifiers.  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  MRA of head and neck May 08, 2023 showed no significant narrowing of carotid arteries in the neck, both vertebral artery anterograde flow, with left side dominant, right hypoplastic MRA of head focal narrowing of left internal carotid artery at the skull base, likely partially healed focal dissection with no flow-limiting  He was put on aspirin plus Plavix  75 mg, overlapping for 30 days, then aspirin alone, also advised him to the emergency room if he develop any focal signs  Reviewed laboratory evaluation, normal A1c RPR folic acid, homocystine, methylmalonic acid level B12 ESR C-reactive protein TSH, CBC, BMP, LDL was 122  Update 06/27/23 SS: Last visit with Dr. Gracie Lav 05/14/23, recommended Plavix  75 mg and aspirin daily for 30 days then aspirin alone, avoid strenuous activity for 6 months. We reviewed initial symptoms, left ptosis, 80% back to normal, normal to visual, to him feels dry, at end of the/early morning feels droopy to him. Headaches are better, hasn't taken anything in 2 weeks. When he wakes up in AM has headache, but not unusual for him, historically sinus issues. Some "sensation" like a line from neck to left temple, could in his head? Does not take gabapentin , has only taken 2, didn't help. Is off the Plavix  now, just aspirin 81 mg daily alone. Has stopped the gym for 2 months now. Avoid strenuous activity. He stopped his Crestor  due to constipation.   Observations/Objective: Via video visit, is alert and oriented, speech is clear and concise, facial symmetry noted, no ptosis noted, seated in his car  REVIEW OF SYSTEMS:  Full 14 system review of systems performed and notable only for as  above All other review of systems were negative.  ALLERGIES: No Known Allergies  HOME MEDICATIONS: Current Outpatient Medications  Medication Sig Dispense Refill   butalbital -acetaminophen-caffeine  (FIORICET) 50-325-40 MG tablet Take 1 tablet by mouth every 8 (eight) hours as needed for headache. 30 tablet 3   clopidogrel  (PLAVIX ) 75 MG tablet Take 1 tablet (75 mg total) by mouth daily. 30 tablet 0   gabapentin  (NEURONTIN ) 300 MG capsule Take 2 capsules (600 mg total) by mouth at bedtime. 60 capsule 11   levothyroxine  (SYNTHROID ) 50 MCG tablet TAKE 1 TABLET BY MOUTH EVERY DAY 90 tablet 1   rosuvastatin  (CRESTOR ) 10 MG tablet TAKE 1 TABLET BY MOUTH EVERY DAY 90 tablet 2   tadalafil  (CIALIS ) 20 MG tablet TAKE 1/2 TO 1 TABLET (10 MG TO 20 MG TOTAL) BY MOUTH EVERY OTHER DAY AS NEEDED FOR ERECTILE DYSFUNCTION 10 tablet 1   No current facility-administered medications for this visit.    PAST MEDICAL HISTORY: Past Medical History:  Diagnosis Date   Arthritis    GSo orto Dx with arthritis   Hyperlipidemia    Thyroid  disease     PAST SURGICAL HISTORY: Past Surgical History:  Procedure Laterality Date   HERNIA REPAIR      FAMILY HISTORY: Family History  Problem Relation  Age of Onset   Healthy Mother    Dementia Maternal Grandmother    Cancer Paternal Grandmother        colon   Diabetes Paternal Grandmother    Migraines Daughter     SOCIAL HISTORY: Social History   Socioeconomic History   Marital status: Married    Spouse name: Not on file   Number of children: Not on file   Years of education: Not on file   Highest education level: Bachelor's degree (e.g., BA, AB, BS)  Occupational History   Not on file  Tobacco Use   Smoking status: Never   Smokeless tobacco: Never  Vaping Use   Vaping status: Never Used  Substance and Sexual Activity   Alcohol use: Not Currently    Comment: has stopped drinking   Drug use: No   Sexual activity: Not on file  Other Topics  Concern   Not on file  Social History Narrative   Not on file   Social Drivers of Health   Financial Resource Strain: Low Risk  (03/14/2023)   Overall Financial Resource Strain (CARDIA)    Difficulty of Paying Living Expenses: Not hard at all  Food Insecurity: No Food Insecurity (03/14/2023)   Hunger Vital Sign    Worried About Running Out of Food in the Last Year: Never true    Ran Out of Food in the Last Year: Never true  Transportation Needs: No Transportation Needs (03/14/2023)   PRAPARE - Administrator, Civil Service (Medical): No    Lack of Transportation (Non-Medical): No  Physical Activity: Sufficiently Active (03/14/2023)   Exercise Vital Sign    Days of Exercise per Week: 4 days    Minutes of Exercise per Session: 40 min  Stress: No Stress Concern Present (03/14/2023)   [employer] Occupational Health - Occupational Stress Questionnaire    Feeling of Stress : Only a little  Social Connections: Unknown (03/14/2023)   Social Connection and Isolation Panel [NHANES]    Frequency of Communication with Friends and Family: Once a week    Frequency of Social Gatherings with Friends and Family: Patient declined    Attends Religious Services: More than 4 times per year    Active Member of [employer] or Organizations: No    Attends Engineer, structural: Not on file    Marital Status: Married  Intimate Partner Violence: Unknown (09/13/2021)   Received from [employer], [facility]   HITS    Physically Hurt: Not on file    Insult or Talk Down To: Not on file    Threaten Physical Harm: Not on file    Scream or Curse: Not on file   Cortland Ding, DNP  [facility] [address] [place], [place] [address] [phone]

## 2023-06-27 NOTE — Patient Instructions (Signed)
 Great to meet you! We will plan CT angiogram head and neck in September 2025 Avoid strenuous activity for 6 months (July 2025) Continue aspirin 81 mg daily Follow up with Dr. Gracie Lav in September

## 2023-06-28 NOTE — Progress Notes (Signed)
 Chart reviewed, agree above plan ?

## 2023-07-03 ENCOUNTER — Telehealth: Payer: Self-pay | Admitting: Neurology

## 2023-07-03 NOTE — Telephone Encounter (Signed)
 I think fishing should be fine, just avoid strenuous activity. Have someone with him in case of heavy lifting.

## 2023-07-03 NOTE — Telephone Encounter (Signed)
 May 6 10:08 am PT LVM  stating that he was told at his last visit that he had certain limitation and wanted to know is it okay for him to go Fishing and be on waves . Pt stated He would like a message back on MyChart .

## 2023-08-16 ENCOUNTER — Telehealth: Payer: Self-pay

## 2023-08-16 DIAGNOSIS — I7771 Dissection of carotid artery: Secondary | ICD-10-CM

## 2023-08-16 NOTE — Telephone Encounter (Signed)
 Copied from CRM [id number]. Topic: General - Other >> Aug 16, 2023  1:53 PM Dorisann Garre T wrote: Reason for CRM: patient is request a referral to [facility] for a neurology dr  he stated he has three drs there he can see he needs the referral sent over to one of those three doctors dr Lavetta Potts dr Arnell Lange

## 2023-08-16 NOTE — Telephone Encounter (Signed)
Referral to [facility] placed.

## 2023-08-16 NOTE — Addendum Note (Signed)
 Addended by: Crawford Tamura E on: 08/16/2023 02:57 PM   Modules accepted: Orders

## 2023-08-16 NOTE — Telephone Encounter (Signed)
 Called patient to let him know a new referral was placed. Advise patient it can take a week for them to contact him. Patient verbalized understanding.

## 2023-08-20 ENCOUNTER — Telehealth: Payer: Self-pay | Admitting: Family Medicine

## 2023-08-20 NOTE — Telephone Encounter (Signed)
 Pt would like to see Juan Oneill with the same office he's already been referred to but different campus

## 2023-08-20 NOTE — Telephone Encounter (Signed)
 Copied from CRM [id number]. Topic: General - Other >> Aug 20, 2023  1:05 PM Deidre DASEN wrote:   Patient is requesting the referral to be for dr alpheus as they see for his condition. Patient stated it is the location for [facility] in [facility], patient stated they have not been receiving the referral. The offices phone number is [phone] and the fax is [phone]

## 2023-10-02 ENCOUNTER — Encounter: Payer: Self-pay | Admitting: Family Medicine

## 2023-10-02 ENCOUNTER — Ambulatory Visit: Payer: 59 | Admitting: Family Medicine

## 2023-10-02 VITALS — BP 102/62 | HR 74 | Temp 98.2°F | Wt 198.2 lb

## 2023-10-02 DIAGNOSIS — E785 Hyperlipidemia, unspecified: Secondary | ICD-10-CM

## 2023-10-02 DIAGNOSIS — E039 Hypothyroidism, unspecified: Secondary | ICD-10-CM | POA: Diagnosis not present

## 2023-10-02 DIAGNOSIS — I7771 Dissection of carotid artery: Secondary | ICD-10-CM | POA: Diagnosis not present

## 2023-10-02 DIAGNOSIS — E663 Overweight: Secondary | ICD-10-CM | POA: Diagnosis not present

## 2023-10-02 LAB — CBC WITH DIFFERENTIAL/PLATELET
Basophils Absolute: 0 K/uL (ref 0.0–0.1)
Basophils Relative: 0.2 % (ref 0.0–3.0)
Eosinophils Absolute: 0.2 K/uL (ref 0.0–0.7)
Eosinophils Relative: 3 % (ref 0.0–5.0)
HCT: 47.3 % (ref 39.0–52.0)
Hemoglobin: 15.5 g/dL (ref 13.0–17.0)
Lymphocytes Relative: 36.2 % (ref 12.0–46.0)
Lymphs Abs: 2.3 K/uL (ref 0.7–4.0)
MCHC: 32.8 g/dL (ref 30.0–36.0)
MCV: 96.5 fl (ref 78.0–100.0)
Monocytes Absolute: 0.6 K/uL (ref 0.1–1.0)
Monocytes Relative: 9.8 % (ref 3.0–12.0)
Neutro Abs: 3.2 K/uL (ref 1.4–7.7)
Neutrophils Relative %: 50.8 % (ref 43.0–77.0)
Platelets: 242 K/uL (ref 150.0–400.0)
RBC: 4.91 Mil/uL (ref 4.22–5.81)
RDW: 13 % (ref 11.5–15.5)
WBC: 6.4 K/uL (ref 4.0–10.5)

## 2023-10-02 LAB — LIPID PANEL
Cholesterol: 199 mg/dL (ref 0–200)
HDL: 41.6 mg/dL (ref >39.00)
LDL Cholesterol: 125 mg/dL — ABNORMAL HIGH (ref 0–99)
NonHDL: 157.83
Total CHOL/HDL Ratio: 5
Triglycerides: 163 mg/dL — ABNORMAL HIGH (ref 0.0–149.0)
VLDL: 32.6 mg/dL (ref 0.0–40.0)

## 2023-10-02 LAB — HEPATIC FUNCTION PANEL
ALT: 24 U/L (ref 0–53)
AST: 20 U/L (ref 0–37)
Albumin: 4.2 g/dL (ref 3.5–5.2)
Alkaline Phosphatase: 82 U/L (ref 39–117)
Bilirubin, Direct: 0.2 mg/dL (ref 0.0–0.3)
Total Bilirubin: 2.3 mg/dL — ABNORMAL HIGH (ref 0.2–1.2)
Total Protein: 6.9 g/dL (ref 6.0–8.3)

## 2023-10-02 LAB — BASIC METABOLIC PANEL WITH GFR
BUN: 13 mg/dL (ref 6–23)
CO2: 31 meq/L (ref 19–32)
Calcium: 9.4 mg/dL (ref 8.4–10.5)
Chloride: 102 meq/L (ref 96–112)
Creatinine, Ser: 0.97 mg/dL (ref 0.40–1.50)
GFR: 82.87 mL/min (ref >60.00)
Glucose, Bld: 88 mg/dL (ref 70–99)
Potassium: 4.3 meq/L (ref 3.5–5.1)
Sodium: 138 meq/L (ref 135–145)

## 2023-10-02 LAB — TSH: TSH: 2.81 u[IU]/mL (ref 0.35–5.50)

## 2023-10-02 NOTE — Patient Instructions (Signed)
 Schedule your complete physical in 6 months We'll notify you of your lab results and make any changes if needed Ask Neuro about the constant headache/throbbing and what is safe to do Call with any questions or concerns Stay Safe!  Stay Healthy! Hang In There!!!

## 2023-10-02 NOTE — Progress Notes (Unsigned)
   Subjective:    Patient ID: Juan Oneill, male    DOB: [birth year], 63 y.o.   MRN: [id number]  HPI Hyperlipidemia- chronic problem.  Pt has known L carotid artery stenosis and hx of L carotid dissection.  Has appt w/ neuro later today.  Stopped Crestor  due to constipation- he was fearful of straining w/ his dissection.    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  States he can 'constantly feel the L side of my head'  Sees Neuro this afternoon.  Is taking 81mg  ASA daily.  Hypothyroid- chronic problem, on Levothyroxine  50mcg daily.  Overweight- pt has gained 7 lbs since January.  Pt was told no strenuous activity after his carotid dissection.  Stopped exercising around March.   Review of Systems For ROS see HPI     Objective:   Physical Exam Vitals reviewed.  Constitutional:      General: He is not in acute distress.    Appearance: Normal appearance. He is well-developed. He is not ill-appearing.  HENT:     Head: Normocephalic and atraumatic.  Eyes:     Extraocular Movements: Extraocular movements intact.     Conjunctiva/sclera: Conjunctivae normal.     Pupils: Pupils are equal, round, and reactive to light.  Neck:     Thyroid : No thyromegaly.  Cardiovascular:     Rate and Rhythm: Normal rate and regular rhythm.     Pulses: Normal pulses.     Heart sounds: Normal heart sounds. No murmur heard. Pulmonary:     Effort: Pulmonary effort is normal. No respiratory distress.     Breath sounds: Normal breath sounds.  Abdominal:     General: Bowel sounds are normal. There is no distension.     Palpations: Abdomen is soft.  Musculoskeletal:     Cervical back: Normal range of motion and neck supple.     Right lower leg: No edema.     Left lower leg: No edema.  Lymphadenopathy:     Cervical: No cervical adenopathy.  Skin:    General: Skin is warm and dry.  Neurological:     General: No focal deficit present.     Mental Status: He is alert and oriented to person,  place, and time.     Cranial Nerves: No cranial nerve deficit.  Psychiatric:        Mood and Affect: Mood normal.        Behavior: Behavior normal.           Assessment & Plan:

## 2023-10-03 ENCOUNTER — Ambulatory Visit: Payer: Self-pay | Admitting: Family Medicine

## 2023-10-03 DIAGNOSIS — E663 Overweight: Secondary | ICD-10-CM | POA: Insufficient documentation

## 2023-10-03 NOTE — Assessment & Plan Note (Signed)
 Ongoing issue.  Pt sees Neuro at [facility] later today.  He has many questions he would like answered- is his ongoing L sided HA due to the dissection, will it be ongoing, is he allowed to exercise.  Encouraged him to take a list of questions- including what is his LDL goal- so we can review the answers together.

## 2023-10-03 NOTE — Assessment & Plan Note (Signed)
 New.  Pt has gained 7 lbs since January.  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  He is frustrated by the weight gain.  Encouraged low carb diet.  Will follow

## 2023-10-03 NOTE — Assessment & Plan Note (Signed)
 Chronic problem.  On Levothyroxine  50mcg daily.  Check labs.  Adjust meds prn

## 2023-10-03 NOTE — Assessment & Plan Note (Addendum)
 Ongoing issue.  Stopped his Crestor  due to constipation.  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  Will check labs and determine if alternative tx is needed

## 2023-10-12 NOTE — Telephone Encounter (Signed)
 Patient is questioning if you want him on a different statin or crestor ?

## 2023-10-16 ENCOUNTER — Telehealth: Payer: Self-pay | Admitting: Neurology

## 2023-10-16 NOTE — Telephone Encounter (Signed)
 Reached out to order CTA head and neck, but looks like patient has transferred care to [facility] and has already undergone imaging.

## 2023-11-20 ENCOUNTER — Telehealth: Admitting: Neurology

## 2023-12-20 ENCOUNTER — Other Ambulatory Visit: Payer: Self-pay | Admitting: Family Medicine

## 2024-02-25 ENCOUNTER — Telehealth: Payer: Self-pay

## 2024-02-25 NOTE — Telephone Encounter (Signed)
 Patient is up to date on Tdap. Last Tdap was given 09/30/22. Will send a mychart message.   Copied from CRM #[id number]. Topic: General - Other >> Feb 25, 2024 11:08 AM Alfonso HERO wrote: Reason for CRM: patient calling to ask if he is up to date on his Dtap injections. Asking for either a call back or mychart message.

## 2024-02-29 ENCOUNTER — Telehealth: Payer: Self-pay

## 2024-02-29 NOTE — Telephone Encounter (Signed)
 Copied from CRM [id number]. Topic: General - Phone/Fax/Address >> Feb 29, 2024 10:49 AM Deleta RAMAN wrote: Patient/patient representative is calling for clinic's phone, fax, or address information. Wants to notify [facility] be calling

## 2024-02-29 NOTE — Telephone Encounter (Signed)
 This message does not make sense. Will wait for them to send something to the office

## 2024-04-03 ENCOUNTER — Encounter: Payer: Self-pay | Admitting: Family Medicine

## 2024-04-03 ENCOUNTER — Ambulatory Visit: Admitting: Family Medicine

## 2024-04-03 VITALS — BP 100/72 | HR 60 | Ht 72.0 in | Wt 201.8 lb

## 2024-04-03 DIAGNOSIS — Z0000 Encounter for general adult medical examination without abnormal findings: Secondary | ICD-10-CM

## 2024-04-03 DIAGNOSIS — Z125 Encounter for screening for malignant neoplasm of prostate: Secondary | ICD-10-CM

## 2024-04-03 DIAGNOSIS — E663 Overweight: Secondary | ICD-10-CM

## 2024-04-03 DIAGNOSIS — Z23 Encounter for immunization: Secondary | ICD-10-CM

## 2024-04-03 LAB — CBC WITH DIFFERENTIAL/PLATELET
Basophils Absolute: 0 10*3/uL (ref 0.0–0.1)
Basophils Relative: 0.3 % (ref 0.0–3.0)
Eosinophils Absolute: 0.1 10*3/uL (ref 0.0–0.7)
Eosinophils Relative: 2.5 % (ref 0.0–5.0)
HCT: 45.2 % (ref 39.0–52.0)
Hemoglobin: 15.3 g/dL (ref 13.0–17.0)
Lymphocytes Relative: 41.3 % (ref 12.0–46.0)
Lymphs Abs: 2.2 10*3/uL (ref 0.7–4.0)
MCHC: 33.8 g/dL (ref 30.0–36.0)
MCV: 96.1 fl (ref 78.0–100.0)
Monocytes Absolute: 0.8 10*3/uL (ref 0.1–1.0)
Monocytes Relative: 14.7 % — ABNORMAL HIGH (ref 3.0–12.0)
Neutro Abs: 2.2 10*3/uL (ref 1.4–7.7)
Neutrophils Relative %: 41.2 % — ABNORMAL LOW (ref 43.0–77.0)
Platelets: 220 10*3/uL (ref 150.0–400.0)
RBC: 4.71 Mil/uL (ref 4.22–5.81)
RDW: 12.9 % (ref 11.5–15.5)
WBC: 5.3 10*3/uL (ref 4.0–10.5)

## 2024-04-03 LAB — LIPID PANEL
Cholesterol: 182 mg/dL (ref 28–200)
HDL: 45.9 mg/dL
LDL Cholesterol: 120 mg/dL — ABNORMAL HIGH (ref 10–99)
NonHDL: 136.43
Total CHOL/HDL Ratio: 4
Triglycerides: 81 mg/dL (ref 10.0–149.0)
VLDL: 16.2 mg/dL (ref 0.0–40.0)

## 2024-04-03 LAB — BASIC METABOLIC PANEL WITH GFR
BUN: 16 mg/dL (ref 6–23)
CO2: 30 meq/L (ref 19–32)
Calcium: 9.2 mg/dL (ref 8.4–10.5)
Chloride: 105 meq/L (ref 96–112)
Creatinine, Ser: 1 mg/dL (ref 0.40–1.50)
GFR: 79.61 mL/min
Glucose, Bld: 89 mg/dL (ref 70–99)
Potassium: 4.2 meq/L (ref 3.5–5.1)
Sodium: 139 meq/L (ref 135–145)

## 2024-04-03 LAB — HEPATIC FUNCTION PANEL
ALT: 27 U/L (ref 3–53)
AST: 28 U/L (ref 5–37)
Albumin: 4.3 g/dL (ref 3.5–5.2)
Alkaline Phosphatase: 86 U/L (ref 39–117)
Bilirubin, Direct: 0.3 mg/dL (ref 0.1–0.3)
Total Bilirubin: 2.6 mg/dL — ABNORMAL HIGH (ref 0.2–1.2)
Total Protein: 6.5 g/dL (ref 6.0–8.3)

## 2024-04-03 LAB — TSH: TSH: 2.3 u[IU]/mL (ref 0.35–5.50)

## 2024-04-03 LAB — PSA: PSA: 1.06 ng/mL (ref 0.10–4.00)

## 2024-04-03 NOTE — Assessment & Plan Note (Signed)
 Pt's PE WNL.  UTD on colonoscopy and Tdap.  Flu and PNA given.  Check labs.  Anticipatory guidance provided.

## 2024-04-03 NOTE — Progress Notes (Signed)
" ° °  Subjective:    Patient ID: Juan Oneill, male    DOB: Dec 01, 1960, 64 y.o.   MRN: [id number]  HPI CPE- due for PNA and flu.  UTD on colonoscopy, Tdap  Health Maintenance  Topic Date Due   Pneumococcal Vaccine: 50+ Years (1 of 1 - PCV) Never done   Zoster Vaccines- Shingrix (2 of 2) 11/25/2022   Influenza Vaccine  09/28/2023   Colonoscopy  02/06/2028   DTaP/Tdap/Td (5 - Td or Tdap) 09/29/2032   HPV VACCINES (No Doses Required) Completed   Hepatitis C Screening  Completed   HIV Screening  Completed   Hepatitis B Vaccines 19-59 Average Risk  Aged Out   Meningococcal B Vaccine  Aged Out   COVID-19 Vaccine  Discontinued    Patient Care Team    Relationship Specialty Notifications Start End  Mahlon Comer BRAVO, MD PCP - General Family Medicine  12/22/15   Specialists, [facility]  Gastroenterology  02/22/17       Review of Systems Patient reports no vision/hearing changes, anorexia, fever ,adenopathy, persistant/recurrent hoarseness, swallowing issues, chest pain, palpitations, edema, persistant/recurrent cough, hemoptysis, dyspnea (rest,exertional, paroxysmal nocturnal), gastrointestinal  bleeding (melena, rectal bleeding), abdominal pain, excessive heart burn, GU symptoms (dysuria, hematuria, voiding/incontinence issues) syncope, focal weakness, memory loss, numbness & tingling, skin/hair/nail changes, depression, anxiety, abnormal bruising/bleeding, musculoskeletal symptoms/signs.     Objective:   Physical Exam General Appearance:    Alert, cooperative, no distress, appears stated age  Head:    Normocephalic, without obvious abnormality, atraumatic  Eyes:    PERRL, conjunctiva/corneas clear, EOM's intact both eyes       Ears:    Normal TM's and external ear canals, both ears  Nose:   Nares normal, septum midline, mucosa normal, no drainage   or sinus tenderness  Throat:   Lips, mucosa, and tongue normal; teeth and gums normal  Neck:   Supple, symmetrical, trachea  midline, no adenopathy;       thyroid :  No enlargement/tenderness/nodules  Back:     Symmetric, no curvature, ROM normal, no CVA tenderness  Lungs:     Clear to auscultation bilaterally, respirations unlabored  Chest wall:    No tenderness or deformity  Heart:    Regular rate and rhythm, S1 and S2 normal, no murmur, rub   or gallop  Abdomen:     Soft, non-tender, bowel sounds active all four quadrants,    no masses, no organomegaly  Genitalia:    deferred  Rectal:    Extremities:   Extremities normal, atraumatic, no cyanosis or edema  Pulses:   2+ and symmetric all extremities  Skin:   Skin color, texture, turgor normal, no rashes or lesions  Lymph nodes:   Cervical, supraclavicular, and axillary nodes normal  Neurologic:   CNII-XII intact. Normal strength, sensation and reflexes      throughout          Assessment & Plan:    "

## 2024-04-03 NOTE — Patient Instructions (Addendum)
Follow up in 1 year or as needed We'll notify you of your lab results and make any changes if needed Continue to work on healthy diet and regular exercise- you look great! Call with any questions or concerns Stay Safe!  Stay Healthy! Happy Valentine's Day!!!

## 2024-04-04 ENCOUNTER — Ambulatory Visit: Payer: Self-pay | Admitting: Family Medicine

## 2024-04-04 NOTE — Progress Notes (Signed)
 Last read by Elsie CHRISTELLA Bunde at 8:38AM on 04/04/2024.

## 2025-04-06 ENCOUNTER — Encounter: Admitting: Family Medicine
